# Patient Record
Sex: Female | Born: 1965 | Race: White | Hispanic: No | Marital: Married | Smoking: Never smoker
Health system: Southern US, Community
[De-identification: ages and names within clinical notes are randomized; demographics above are authoritative.]

## PROBLEM LIST (undated history)

## (undated) DIAGNOSIS — Z8669 Personal history of other diseases of the nervous system and sense organs: Secondary | ICD-10-CM

## (undated) DIAGNOSIS — D721 Eosinophilia, unspecified: Secondary | ICD-10-CM

## (undated) DIAGNOSIS — T148XXA Other injury of unspecified body region, initial encounter: Secondary | ICD-10-CM

## (undated) DIAGNOSIS — E119 Type 2 diabetes mellitus without complications: Secondary | ICD-10-CM

## (undated) DIAGNOSIS — I82409 Acute embolism and thrombosis of unspecified deep veins of unspecified lower extremity: Secondary | ICD-10-CM

## (undated) HISTORY — DX: Personal history of other diseases of the nervous system and sense organs: Z86.69

## (undated) HISTORY — DX: Eosinophilia, unspecified: D72.10

## (undated) HISTORY — DX: Type 2 diabetes mellitus without complications: E11.9

## (undated) HISTORY — DX: Other injury of unspecified body region, initial encounter: T14.8XXA

## (undated) HISTORY — PX: WISDOM TOOTH EXTRACTION: SHX21

---

## 1998-10-29 ENCOUNTER — Other Ambulatory Visit: Admission: RE | Admit: 1998-10-29 | Discharge: 1998-10-29 | Payer: Self-pay | Admitting: Obstetrics and Gynecology

## 1999-12-22 ENCOUNTER — Other Ambulatory Visit: Admission: RE | Admit: 1999-12-22 | Discharge: 1999-12-22 | Payer: Self-pay | Admitting: Obstetrics and Gynecology

## 2000-09-29 ENCOUNTER — Emergency Department (HOSPITAL_COMMUNITY): Admission: EM | Admit: 2000-09-29 | Discharge: 2000-09-29 | Payer: Self-pay | Admitting: Internal Medicine

## 2000-12-18 HISTORY — PX: CHOLECYSTECTOMY: SHX55

## 2001-01-22 ENCOUNTER — Other Ambulatory Visit: Admission: RE | Admit: 2001-01-22 | Discharge: 2001-01-22 | Payer: Self-pay | Admitting: Obstetrics and Gynecology

## 2001-02-19 ENCOUNTER — Encounter: Admission: RE | Admit: 2001-02-19 | Discharge: 2001-05-20 | Payer: Self-pay | Admitting: Family Medicine

## 2001-08-28 ENCOUNTER — Encounter: Payer: Self-pay | Admitting: Family Medicine

## 2001-08-28 ENCOUNTER — Encounter: Admission: RE | Admit: 2001-08-28 | Discharge: 2001-08-28 | Payer: Self-pay | Admitting: Family Medicine

## 2001-09-11 ENCOUNTER — Encounter (INDEPENDENT_AMBULATORY_CARE_PROVIDER_SITE_OTHER): Payer: Self-pay | Admitting: Specialist

## 2001-09-11 ENCOUNTER — Observation Stay (HOSPITAL_COMMUNITY): Admission: RE | Admit: 2001-09-11 | Discharge: 2001-09-12 | Payer: Self-pay | Admitting: General Surgery

## 2001-10-07 ENCOUNTER — Encounter: Admission: RE | Admit: 2001-10-07 | Discharge: 2002-01-05 | Payer: Self-pay | Admitting: Family Medicine

## 2002-02-03 ENCOUNTER — Other Ambulatory Visit: Admission: RE | Admit: 2002-02-03 | Discharge: 2002-02-03 | Payer: Self-pay | Admitting: Obstetrics and Gynecology

## 2002-02-25 ENCOUNTER — Encounter: Admission: RE | Admit: 2002-02-25 | Discharge: 2002-05-26 | Payer: Self-pay | Admitting: Family Medicine

## 2002-08-19 ENCOUNTER — Encounter: Payer: Self-pay | Admitting: Obstetrics and Gynecology

## 2002-08-19 ENCOUNTER — Ambulatory Visit (HOSPITAL_COMMUNITY): Admission: RE | Admit: 2002-08-19 | Discharge: 2002-08-19 | Payer: Self-pay | Admitting: Obstetrics and Gynecology

## 2002-11-04 ENCOUNTER — Encounter: Admission: RE | Admit: 2002-11-04 | Discharge: 2002-11-19 | Payer: Self-pay | Admitting: Family Medicine

## 2002-12-08 ENCOUNTER — Ambulatory Visit (HOSPITAL_COMMUNITY): Admission: RE | Admit: 2002-12-08 | Discharge: 2002-12-08 | Payer: Self-pay | Admitting: Obstetrics and Gynecology

## 2002-12-08 ENCOUNTER — Encounter: Payer: Self-pay | Admitting: Obstetrics and Gynecology

## 2002-12-10 ENCOUNTER — Encounter (HOSPITAL_COMMUNITY): Admission: AD | Admit: 2002-12-10 | Discharge: 2002-12-26 | Payer: Self-pay | Admitting: Obstetrics and Gynecology

## 2002-12-19 ENCOUNTER — Encounter: Payer: Self-pay | Admitting: Obstetrics and Gynecology

## 2002-12-26 ENCOUNTER — Encounter: Payer: Self-pay | Admitting: Obstetrics and Gynecology

## 2003-01-07 ENCOUNTER — Encounter (INDEPENDENT_AMBULATORY_CARE_PROVIDER_SITE_OTHER): Payer: Self-pay | Admitting: Specialist

## 2003-01-07 ENCOUNTER — Inpatient Hospital Stay (HOSPITAL_COMMUNITY): Admission: AD | Admit: 2003-01-07 | Discharge: 2003-01-10 | Payer: Self-pay | Admitting: Obstetrics and Gynecology

## 2003-02-20 ENCOUNTER — Other Ambulatory Visit: Admission: RE | Admit: 2003-02-20 | Discharge: 2003-02-20 | Payer: Self-pay | Admitting: Obstetrics and Gynecology

## 2003-07-16 ENCOUNTER — Encounter: Payer: Self-pay | Admitting: Family Medicine

## 2003-07-16 ENCOUNTER — Encounter: Admission: RE | Admit: 2003-07-16 | Discharge: 2003-07-16 | Payer: Self-pay | Admitting: Family Medicine

## 2004-05-04 ENCOUNTER — Other Ambulatory Visit: Admission: RE | Admit: 2004-05-04 | Discharge: 2004-05-04 | Payer: Self-pay | Admitting: Obstetrics and Gynecology

## 2004-12-20 ENCOUNTER — Encounter: Admission: RE | Admit: 2004-12-20 | Discharge: 2005-03-20 | Payer: Self-pay | Admitting: Family Medicine

## 2005-05-10 ENCOUNTER — Other Ambulatory Visit: Admission: RE | Admit: 2005-05-10 | Discharge: 2005-05-10 | Payer: Self-pay | Admitting: Obstetrics and Gynecology

## 2005-06-01 ENCOUNTER — Encounter: Admission: RE | Admit: 2005-06-01 | Discharge: 2005-08-30 | Payer: Self-pay | Admitting: Family Medicine

## 2005-08-10 ENCOUNTER — Ambulatory Visit: Payer: Self-pay | Admitting: Oncology

## 2005-10-18 ENCOUNTER — Ambulatory Visit: Payer: Self-pay | Admitting: Oncology

## 2006-02-15 ENCOUNTER — Ambulatory Visit: Payer: Self-pay | Admitting: Oncology

## 2006-05-17 ENCOUNTER — Other Ambulatory Visit: Admission: RE | Admit: 2006-05-17 | Discharge: 2006-05-17 | Payer: Self-pay | Admitting: Obstetrics and Gynecology

## 2006-07-18 ENCOUNTER — Ambulatory Visit: Payer: Self-pay | Admitting: Oncology

## 2006-07-19 LAB — CBC WITH DIFFERENTIAL/PLATELET
BASO%: 0.7 % (ref 0.0–2.0)
EOS%: 5.1 % (ref 0.0–7.0)
HCT: 38.2 % (ref 34.8–46.6)
MCH: 27.4 pg (ref 26.0–34.0)
MCHC: 34.2 g/dL (ref 32.0–36.0)
NEUT%: 60.5 % (ref 39.6–76.8)
lymph#: 2.2 10*3/uL (ref 0.9–3.3)

## 2006-07-24 LAB — HYPERCOAGULABLE PANEL, COMPREHENSIVE
Anticardiolipin IgM: 11 [MPL'U] (ref <10)
Beta-2 Glyco I IgG: 4 U/mL (ref <20)
Beta-2-Glycoprotein I IgM: 5 U/mL (ref <10)
Protein C, Total: 52 % — ABNORMAL LOW (ref 63–153)
Protein S Activity: 90 % (ref 81–180)
Protein S Ag, Total: 110 % (ref 63–126)

## 2007-11-18 ENCOUNTER — Encounter: Admission: RE | Admit: 2007-11-18 | Discharge: 2007-11-18 | Payer: Self-pay | Admitting: Physician Assistant

## 2008-12-25 ENCOUNTER — Inpatient Hospital Stay (HOSPITAL_COMMUNITY): Admission: EM | Admit: 2008-12-25 | Discharge: 2009-01-02 | Payer: Self-pay | Admitting: *Deleted

## 2009-01-07 ENCOUNTER — Inpatient Hospital Stay (HOSPITAL_COMMUNITY): Admission: EM | Admit: 2009-01-07 | Discharge: 2009-01-18 | Payer: Self-pay | Admitting: Emergency Medicine

## 2009-01-08 ENCOUNTER — Encounter (INDEPENDENT_AMBULATORY_CARE_PROVIDER_SITE_OTHER): Payer: Self-pay | Admitting: Internal Medicine

## 2009-01-08 ENCOUNTER — Ambulatory Visit: Payer: Self-pay | Admitting: Infectious Diseases

## 2009-01-11 ENCOUNTER — Ambulatory Visit: Payer: Self-pay | Admitting: Hematology & Oncology

## 2009-01-14 ENCOUNTER — Encounter (INDEPENDENT_AMBULATORY_CARE_PROVIDER_SITE_OTHER): Payer: Self-pay | Admitting: Interventional Radiology

## 2009-01-21 ENCOUNTER — Ambulatory Visit: Payer: Self-pay | Admitting: Infectious Diseases

## 2009-01-21 DIAGNOSIS — R509 Fever, unspecified: Secondary | ICD-10-CM

## 2009-01-21 DIAGNOSIS — D721 Eosinophilia: Secondary | ICD-10-CM

## 2009-01-21 DIAGNOSIS — D649 Anemia, unspecified: Secondary | ICD-10-CM

## 2009-01-21 LAB — CONVERTED CEMR LAB
Basophils Absolute: 0.1 10*3/uL (ref 0.0–0.1)
Basophils Relative: 1 % (ref 0–1)
Eosinophils Absolute: 4.9 10*3/uL — ABNORMAL HIGH (ref 0.0–0.7)
Eosinophils Relative: 40 % — ABNORMAL HIGH (ref 0–5)
HCT: 33 % — ABNORMAL LOW (ref 36.0–46.0)
Hemoglobin: 10.8 g/dL — ABNORMAL LOW (ref 12.0–15.0)
MCHC: 32.7 g/dL (ref 30.0–36.0)
MCV: 97.6 fL (ref 78.0–100.0)
Monocytes Absolute: 0.6 10*3/uL (ref 0.1–1.0)
RDW: 24.3 % — ABNORMAL HIGH (ref 11.5–15.5)

## 2009-01-25 ENCOUNTER — Ambulatory Visit: Payer: Self-pay | Admitting: Hematology & Oncology

## 2009-02-03 ENCOUNTER — Emergency Department (HOSPITAL_COMMUNITY): Admission: EM | Admit: 2009-02-03 | Discharge: 2009-02-04 | Payer: Self-pay | Admitting: Emergency Medicine

## 2009-02-15 LAB — CBC WITH DIFFERENTIAL (CANCER CENTER ONLY)
BASO#: 0.1 10*3/uL (ref 0.0–0.2)
Eosinophils Absolute: 1.4 10*3/uL — ABNORMAL HIGH (ref 0.0–0.5)
HCT: 40 % (ref 34.8–46.6)
HGB: 12.9 g/dL (ref 11.6–15.9)
LYMPH#: 1.5 10*3/uL (ref 0.9–3.3)
MONO#: 0.4 10*3/uL (ref 0.1–0.9)
NEUT#: 9.6 10*3/uL — ABNORMAL HIGH (ref 1.5–6.5)
NEUT%: 73.9 % (ref 39.6–80.0)
RBC: 4.51 10*6/uL (ref 3.70–5.32)
WBC: 13 10*3/uL — ABNORMAL HIGH (ref 3.9–10.0)

## 2009-02-15 LAB — RETICULOCYTES (CHCC)
ABS Retic: 44.2 10*3/uL (ref 19.0–186.0)
RBC.: 4.42 MIL/uL (ref 3.87–5.11)
Retic Ct Pct: 1 % (ref 0.4–3.1)

## 2009-02-15 LAB — FERRITIN: Ferritin: 181 ng/mL (ref 10–291)

## 2009-02-23 ENCOUNTER — Ambulatory Visit: Payer: Self-pay | Admitting: Infectious Diseases

## 2009-02-27 ENCOUNTER — Observation Stay (HOSPITAL_COMMUNITY): Admission: EM | Admit: 2009-02-27 | Discharge: 2009-02-27 | Payer: Self-pay | Admitting: Emergency Medicine

## 2009-02-27 ENCOUNTER — Encounter (INDEPENDENT_AMBULATORY_CARE_PROVIDER_SITE_OTHER): Payer: Self-pay | Admitting: Emergency Medicine

## 2009-02-27 ENCOUNTER — Ambulatory Visit: Payer: Self-pay | Admitting: Surgery

## 2009-03-09 LAB — CBC WITH DIFFERENTIAL (CANCER CENTER ONLY)
BASO#: 0.1 10*3/uL (ref 0.0–0.2)
EOS%: 28.9 % — ABNORMAL HIGH (ref 0.0–7.0)
Eosinophils Absolute: 2.2 10*3/uL — ABNORMAL HIGH (ref 0.0–0.5)
HCT: 39.5 % (ref 34.8–46.6)
HGB: 12.9 g/dL (ref 11.6–15.9)
MCH: 29.2 pg (ref 26.0–34.0)
MCHC: 32.7 g/dL (ref 32.0–36.0)
MONO%: 5.4 % (ref 0.0–13.0)
NEUT#: 3.6 10*3/uL (ref 1.5–6.5)
NEUT%: 47.4 % (ref 39.6–80.0)
RBC: 4.42 10*6/uL (ref 3.70–5.32)

## 2009-03-09 LAB — PROTIME-INR (CHCC SATELLITE)

## 2009-03-09 LAB — CHCC SATELLITE - SMEAR

## 2009-03-15 ENCOUNTER — Ambulatory Visit: Payer: Self-pay | Admitting: Hematology & Oncology

## 2009-03-16 LAB — CBC WITH DIFFERENTIAL (CANCER CENTER ONLY)
BASO#: 0.1 10*3/uL (ref 0.0–0.2)
EOS%: 30.6 % — ABNORMAL HIGH (ref 0.0–7.0)
Eosinophils Absolute: 2.9 10*3/uL — ABNORMAL HIGH (ref 0.0–0.5)
HGB: 11.9 g/dL (ref 11.6–15.9)
LYMPH%: 21.2 % (ref 14.0–48.0)
MCH: 29 pg (ref 26.0–34.0)
MCHC: 32.6 g/dL (ref 32.0–36.0)
MCV: 89 fL (ref 81–101)
MONO%: 4.4 % (ref 0.0–13.0)
NEUT#: 4.1 10*3/uL (ref 1.5–6.5)
Platelets: 238 10*3/uL (ref 145–400)
RBC: 4.1 10*6/uL (ref 3.70–5.32)

## 2009-03-16 LAB — TECHNOLOGIST REVIEW CHCC SATELLITE

## 2009-03-16 LAB — PROTIME-INR (CHCC SATELLITE)
INR: 2.1 (ref 2.0–3.5)
Protime: 25.2 Seconds — ABNORMAL HIGH (ref 10.6–13.4)

## 2009-03-23 LAB — CBC WITH DIFFERENTIAL (CANCER CENTER ONLY)
Eosinophils Absolute: 2.7 10*3/uL — ABNORMAL HIGH (ref 0.0–0.5)
HCT: 39.3 % (ref 34.8–46.6)
HGB: 12.9 g/dL (ref 11.6–15.9)
LYMPH#: 1.9 10*3/uL (ref 0.9–3.3)
MCH: 28.7 pg (ref 26.0–34.0)
MCHC: 32.8 g/dL (ref 32.0–36.0)
MCV: 88 fL (ref 81–101)
MONO#: 0.5 10*3/uL (ref 0.1–0.9)
MONO%: 4.9 % (ref 0.0–13.0)
NEUT#: 4.1 10*3/uL (ref 1.5–6.5)
RDW: 13.3 % (ref 10.5–14.6)

## 2009-03-23 LAB — PROTIME-INR (CHCC SATELLITE)

## 2009-03-26 ENCOUNTER — Ambulatory Visit: Payer: Self-pay | Admitting: Internal Medicine

## 2009-03-26 ENCOUNTER — Inpatient Hospital Stay (HOSPITAL_COMMUNITY): Admission: EM | Admit: 2009-03-26 | Discharge: 2009-03-29 | Payer: Self-pay | Admitting: Emergency Medicine

## 2009-03-27 ENCOUNTER — Ambulatory Visit: Payer: Self-pay | Admitting: *Deleted

## 2009-03-27 ENCOUNTER — Encounter (INDEPENDENT_AMBULATORY_CARE_PROVIDER_SITE_OTHER): Payer: Self-pay | Admitting: Diagnostic Radiology

## 2009-03-31 LAB — CBC WITH DIFFERENTIAL (CANCER CENTER ONLY)
BASO%: 0.8 % (ref 0.0–2.0)
EOS%: 37.8 % — ABNORMAL HIGH (ref 0.0–7.0)
HCT: 37 % (ref 34.8–46.6)
LYMPH%: 17.2 % (ref 14.0–48.0)
MCH: 28.3 pg (ref 26.0–34.0)
MCHC: 33.1 g/dL (ref 32.0–36.0)
MCV: 86 fL (ref 81–101)
NEUT%: 40.2 % (ref 39.6–80.0)
RDW: 13.3 % (ref 10.5–14.6)

## 2009-03-31 LAB — PROTIME-INR (CHCC SATELLITE): Protime: 37.2 Seconds — ABNORMAL HIGH (ref 10.6–13.4)

## 2009-04-06 LAB — CBC WITH DIFFERENTIAL (CANCER CENTER ONLY)
Eosinophils Absolute: 1.2 10*3/uL — ABNORMAL HIGH (ref 0.0–0.5)
HCT: 38.1 % (ref 34.8–46.6)
LYMPH%: 31.6 % (ref 14.0–48.0)
MCV: 85 fL (ref 81–101)
MONO#: 0.7 10*3/uL (ref 0.1–0.9)
NEUT%: 51.6 % (ref 39.6–80.0)
RBC: 4.47 10*6/uL (ref 3.70–5.32)
RDW: 12.5 % (ref 10.5–14.6)
WBC: 11.7 10*3/uL — ABNORMAL HIGH (ref 3.9–10.0)

## 2009-04-06 LAB — PROTIME-INR (CHCC SATELLITE): Protime: 28.8 Seconds — ABNORMAL HIGH (ref 10.6–13.4)

## 2009-04-13 LAB — CBC WITH DIFFERENTIAL (CANCER CENTER ONLY)
Eosinophils Absolute: 1.2 10*3/uL — ABNORMAL HIGH (ref 0.0–0.5)
HCT: 39.7 % (ref 34.8–46.6)
HGB: 13.3 g/dL (ref 11.6–15.9)
LYMPH%: 24.3 % (ref 14.0–48.0)
MCV: 85 fL (ref 81–101)
MONO#: 0.7 10*3/uL (ref 0.1–0.9)
Platelets: 230 10*3/uL (ref 145–400)
RBC: 4.65 10*6/uL (ref 3.70–5.32)
WBC: 10.6 10*3/uL — ABNORMAL HIGH (ref 3.9–10.0)

## 2009-05-03 ENCOUNTER — Ambulatory Visit: Payer: Self-pay | Admitting: Hematology & Oncology

## 2009-05-04 LAB — CBC WITH DIFFERENTIAL (CANCER CENTER ONLY)
BASO#: 0.1 10*3/uL (ref 0.0–0.2)
Eosinophils Absolute: 0.8 10*3/uL — ABNORMAL HIGH (ref 0.0–0.5)
HCT: 37.8 % (ref 34.8–46.6)
HGB: 13.1 g/dL (ref 11.6–15.9)
LYMPH%: 28.5 % (ref 14.0–48.0)
MCV: 82 fL (ref 81–101)
MONO#: 0.5 10*3/uL (ref 0.1–0.9)
NEUT%: 56.9 % (ref 39.6–80.0)
RBC: 4.61 10*6/uL (ref 3.70–5.32)
RDW: 12.3 % (ref 10.5–14.6)
WBC: 9.4 10*3/uL (ref 3.9–10.0)

## 2009-05-04 LAB — PROTIME-INR (CHCC SATELLITE): INR: 1.5 — ABNORMAL LOW (ref 2.0–3.5)

## 2009-05-04 LAB — MORPHOLOGY - CHCC SATELLITE
PLT EST ~~LOC~~: ADEQUATE
Platelet Morphology: NORMAL

## 2009-05-18 LAB — CBC WITH DIFFERENTIAL (CANCER CENTER ONLY)
BASO%: 0.7 % (ref 0.0–2.0)
Eosinophils Absolute: 1 10*3/uL — ABNORMAL HIGH (ref 0.0–0.5)
MCH: 27.2 pg (ref 26.0–34.0)
MONO#: 0.6 10*3/uL (ref 0.1–0.9)
MONO%: 5.5 % (ref 0.0–13.0)
NEUT#: 5.8 10*3/uL (ref 1.5–6.5)
Platelets: 227 10*3/uL (ref 145–400)
RBC: 4.84 10*6/uL (ref 3.70–5.32)
RDW: 12.8 % (ref 10.5–14.6)
WBC: 10.4 10*3/uL — ABNORMAL HIGH (ref 3.9–10.0)

## 2009-05-18 LAB — MORPHOLOGY - CHCC SATELLITE

## 2009-05-18 LAB — PROTIME-INR (CHCC SATELLITE)
INR: 3.3 (ref 2.0–3.5)
Protime: 39.6 Seconds — ABNORMAL HIGH (ref 10.6–13.4)

## 2009-05-26 ENCOUNTER — Ambulatory Visit (HOSPITAL_COMMUNITY): Admission: RE | Admit: 2009-05-26 | Discharge: 2009-05-26 | Payer: Self-pay | Admitting: Hematology & Oncology

## 2009-05-26 ENCOUNTER — Encounter: Payer: Self-pay | Admitting: Hematology & Oncology

## 2009-05-26 ENCOUNTER — Ambulatory Visit: Payer: Self-pay | Admitting: Hematology & Oncology

## 2009-06-01 LAB — CBC WITH DIFFERENTIAL (CANCER CENTER ONLY)
Eosinophils Absolute: 0.8 10*3/uL — ABNORMAL HIGH (ref 0.0–0.5)
LYMPH#: 2.2 10*3/uL (ref 0.9–3.3)
LYMPH%: 24.9 % (ref 14.0–48.0)
MCV: 82 fL (ref 81–101)
MONO#: 0.5 10*3/uL (ref 0.1–0.9)
Platelets: 230 10*3/uL (ref 145–400)
RBC: 4.66 10*6/uL (ref 3.70–5.32)
WBC: 8.9 10*3/uL (ref 3.9–10.0)

## 2009-06-01 LAB — PROTIME-INR (CHCC SATELLITE): INR: 2.2 (ref 2.0–3.5)

## 2009-06-01 LAB — COMPREHENSIVE METABOLIC PANEL
ALT: 17 U/L (ref 0–35)
AST: 13 U/L (ref 0–37)
Alkaline Phosphatase: 61 U/L (ref 39–117)
BUN: 14 mg/dL (ref 6–23)
Calcium: 8.7 mg/dL (ref 8.4–10.5)
Creatinine, Ser: 0.73 mg/dL (ref 0.40–1.20)
Total Bilirubin: 0.6 mg/dL (ref 0.3–1.2)

## 2009-06-01 LAB — MORPHOLOGY - CHCC SATELLITE
PLT EST ~~LOC~~: ADEQUATE
Platelet Morphology: NORMAL

## 2009-06-22 ENCOUNTER — Ambulatory Visit: Payer: Self-pay | Admitting: Hematology & Oncology

## 2009-06-22 LAB — CBC WITH DIFFERENTIAL (CANCER CENTER ONLY)
BASO#: 0.1 10*3/uL (ref 0.0–0.2)
EOS%: 8.5 % — ABNORMAL HIGH (ref 0.0–7.0)
HCT: 37.8 % (ref 34.8–46.6)
HGB: 12.1 g/dL (ref 11.6–15.9)
LYMPH%: 23.6 % (ref 14.0–48.0)
MCH: 26.1 pg (ref 26.0–34.0)
MCHC: 32.1 g/dL (ref 32.0–36.0)
MONO%: 5.9 % (ref 0.0–13.0)
NEUT%: 60.9 % (ref 39.6–80.0)

## 2009-06-22 LAB — PROTIME-INR (CHCC SATELLITE): INR: 2.9 (ref 2.0–3.5)

## 2009-07-13 LAB — CBC WITH DIFFERENTIAL (CANCER CENTER ONLY)
EOS%: 9.8 % — ABNORMAL HIGH (ref 0.0–7.0)
MCH: 26.1 pg (ref 26.0–34.0)
MCHC: 33 g/dL (ref 32.0–36.0)
MONO%: 5.4 % (ref 0.0–13.0)
NEUT#: 4.1 10*3/uL (ref 1.5–6.5)
Platelets: 248 10*3/uL (ref 145–400)

## 2009-07-13 LAB — CHCC SATELLITE - SMEAR

## 2009-07-13 LAB — PROTIME-INR (CHCC SATELLITE)
INR: 2.7 (ref 2.0–3.5)
Protime: 32.4 Seconds — ABNORMAL HIGH (ref 10.6–13.4)

## 2009-08-02 ENCOUNTER — Ambulatory Visit: Payer: Self-pay | Admitting: Hematology & Oncology

## 2009-08-03 LAB — CBC WITH DIFFERENTIAL (CANCER CENTER ONLY)
BASO#: 0.1 10*3/uL (ref 0.0–0.2)
EOS%: 7.2 % — ABNORMAL HIGH (ref 0.0–7.0)
HCT: 35.6 % (ref 34.8–46.6)
HGB: 11.8 g/dL (ref 11.6–15.9)
LYMPH#: 2.7 10*3/uL (ref 0.9–3.3)
MCHC: 33.2 g/dL (ref 32.0–36.0)
MONO#: 0.6 10*3/uL (ref 0.1–0.9)
NEUT%: 58.7 % (ref 39.6–80.0)

## 2009-08-03 LAB — PROTIME-INR (CHCC SATELLITE)
INR: 3.1 (ref 2.0–3.5)
Protime: 37.2 Seconds — ABNORMAL HIGH (ref 10.6–13.4)

## 2009-08-31 LAB — CBC WITH DIFFERENTIAL (CANCER CENTER ONLY)
BASO#: 0.1 10*3/uL (ref 0.0–0.2)
BASO%: 0.9 % (ref 0.0–2.0)
Eosinophils Absolute: 0.8 10*3/uL — ABNORMAL HIGH (ref 0.0–0.5)
HCT: 37.9 % (ref 34.8–46.6)
HGB: 12.5 g/dL (ref 11.6–15.9)
LYMPH#: 2.6 10*3/uL (ref 0.9–3.3)
MONO#: 0.5 10*3/uL (ref 0.1–0.9)
NEUT%: 56.2 % (ref 39.6–80.0)
RBC: 4.88 10*6/uL (ref 3.70–5.32)
RDW: 13.3 % (ref 10.5–14.6)
WBC: 8.9 10*3/uL (ref 3.9–10.0)

## 2009-08-31 LAB — PROTIME-INR (CHCC SATELLITE): INR: 2.7 (ref 2.0–3.5)

## 2009-09-27 ENCOUNTER — Ambulatory Visit: Payer: Self-pay | Admitting: Hematology & Oncology

## 2009-09-28 LAB — CBC WITH DIFFERENTIAL (CANCER CENTER ONLY)
BASO#: 0.1 10*3/uL (ref 0.0–0.2)
EOS%: 7.3 % — ABNORMAL HIGH (ref 0.0–7.0)
Eosinophils Absolute: 0.7 10*3/uL — ABNORMAL HIGH (ref 0.0–0.5)
HCT: 35.6 % (ref 34.8–46.6)
HGB: 11.9 g/dL (ref 11.6–15.9)
LYMPH#: 2.2 10*3/uL (ref 0.9–3.3)
MCH: 25.4 pg — ABNORMAL LOW (ref 26.0–34.0)
MCHC: 33.5 g/dL (ref 32.0–36.0)
NEUT%: 62.9 % (ref 39.6–80.0)
RBC: 4.69 10*6/uL (ref 3.70–5.32)

## 2009-09-28 LAB — COMPREHENSIVE METABOLIC PANEL
Alkaline Phosphatase: 77 U/L (ref 39–117)
Creatinine, Ser: 0.68 mg/dL (ref 0.40–1.20)
Glucose, Bld: 129 mg/dL — ABNORMAL HIGH (ref 70–99)
Sodium: 139 mEq/L (ref 135–145)
Total Bilirubin: 0.5 mg/dL (ref 0.3–1.2)
Total Protein: 6.8 g/dL (ref 6.0–8.3)

## 2009-09-28 LAB — PROTIME-INR (CHCC SATELLITE)

## 2009-10-21 ENCOUNTER — Encounter: Admission: RE | Admit: 2009-10-21 | Discharge: 2009-10-21 | Payer: Self-pay | Admitting: Obstetrics and Gynecology

## 2009-11-01 ENCOUNTER — Ambulatory Visit: Payer: Self-pay | Admitting: Hematology & Oncology

## 2009-11-02 LAB — CBC WITH DIFFERENTIAL (CANCER CENTER ONLY)
BASO#: 0.1 10*3/uL (ref 0.0–0.2)
BASO%: 0.6 % (ref 0.0–2.0)
HCT: 34.2 % — ABNORMAL LOW (ref 34.8–46.6)
HGB: 11.7 g/dL (ref 11.6–15.9)
LYMPH#: 2.1 10*3/uL (ref 0.9–3.3)
MONO#: 0.5 10*3/uL (ref 0.1–0.9)
NEUT#: 7 10*3/uL — ABNORMAL HIGH (ref 1.5–6.5)
NEUT%: 67.9 % (ref 39.6–80.0)
WBC: 10.4 10*3/uL — ABNORMAL HIGH (ref 3.9–10.0)

## 2009-11-02 LAB — PROTIME-INR (CHCC SATELLITE)
INR: 2.8 (ref 2.0–3.5)
Protime: 33.6 Seconds — ABNORMAL HIGH (ref 10.6–13.4)

## 2009-11-02 LAB — MORPHOLOGY - CHCC SATELLITE

## 2009-11-03 LAB — VITAMIN D 25 HYDROXY (VIT D DEFICIENCY, FRACTURES): Vit D, 25-Hydroxy: 30 ng/mL (ref 30–89)

## 2009-11-25 ENCOUNTER — Observation Stay (HOSPITAL_COMMUNITY): Admission: EM | Admit: 2009-11-25 | Discharge: 2009-11-26 | Payer: Self-pay | Admitting: Emergency Medicine

## 2009-11-26 ENCOUNTER — Ambulatory Visit: Payer: Self-pay | Admitting: Vascular Surgery

## 2009-12-02 ENCOUNTER — Ambulatory Visit: Payer: Self-pay | Admitting: Hematology & Oncology

## 2009-12-03 LAB — CBC WITH DIFFERENTIAL (CANCER CENTER ONLY)
BASO%: 0.9 % (ref 0.0–2.0)
LYMPH#: 2.4 10*3/uL (ref 0.9–3.3)
MONO#: 0.4 10*3/uL (ref 0.1–0.9)
NEUT#: 4.3 10*3/uL (ref 1.5–6.5)
Platelets: 239 10*3/uL (ref 145–400)
RDW: 17 % — ABNORMAL HIGH (ref 10.5–14.6)
WBC: 8.4 10*3/uL (ref 3.9–10.0)

## 2009-12-03 LAB — PROTIME-INR (CHCC SATELLITE): Protime: 13.2 Seconds (ref 10.6–13.4)

## 2009-12-21 LAB — CBC WITH DIFFERENTIAL (CANCER CENTER ONLY)
BASO%: 0.5 % (ref 0.0–2.0)
HCT: 41.1 % (ref 34.8–46.6)
LYMPH%: 24.3 % (ref 14.0–48.0)
MCH: 26.5 pg (ref 26.0–34.0)
MCV: 81 fL (ref 81–101)
MONO%: 5.4 % (ref 0.0–13.0)
NEUT%: 66.4 % (ref 39.6–80.0)
Platelets: 258 10*3/uL (ref 145–400)
RDW: 18.4 % — ABNORMAL HIGH (ref 10.5–14.6)

## 2009-12-21 LAB — LACTATE DEHYDROGENASE: LDH: 154 U/L (ref 94–250)

## 2009-12-21 LAB — CHCC SATELLITE - SMEAR

## 2010-01-17 ENCOUNTER — Ambulatory Visit: Payer: Self-pay | Admitting: Hematology & Oncology

## 2010-01-18 LAB — PROTIME-INR (CHCC SATELLITE): Protime: 12 Seconds (ref 10.6–13.4)

## 2010-01-18 LAB — CBC WITH DIFFERENTIAL (CANCER CENTER ONLY)
BASO%: 0.7 % (ref 0.0–2.0)
EOS%: 4.2 % (ref 0.0–7.0)
HCT: 41.3 % (ref 34.8–46.6)
LYMPH%: 25.3 % (ref 14.0–48.0)
MCHC: 33.7 g/dL (ref 32.0–36.0)
MCV: 82 fL (ref 81–101)
MONO%: 5.1 % (ref 0.0–13.0)
NEUT%: 64.7 % (ref 39.6–80.0)
RDW: 15 % — ABNORMAL HIGH (ref 10.5–14.6)

## 2010-02-08 ENCOUNTER — Ambulatory Visit: Payer: Self-pay | Admitting: Diagnostic Radiology

## 2010-02-08 ENCOUNTER — Ambulatory Visit (HOSPITAL_BASED_OUTPATIENT_CLINIC_OR_DEPARTMENT_OTHER): Admission: RE | Admit: 2010-02-08 | Discharge: 2010-02-08 | Payer: Self-pay | Admitting: Hematology & Oncology

## 2010-02-15 LAB — CBC WITH DIFFERENTIAL (CANCER CENTER ONLY)
BASO%: 0.6 % (ref 0.0–2.0)
Eosinophils Absolute: 0.6 10*3/uL — ABNORMAL HIGH (ref 0.0–0.5)
HCT: 40.1 % (ref 34.8–46.6)
LYMPH#: 2.3 10*3/uL (ref 0.9–3.3)
MONO#: 0.5 10*3/uL (ref 0.1–0.9)
NEUT%: 67.1 % (ref 39.6–80.0)
Platelets: 250 10*3/uL (ref 145–400)
RBC: 4.88 10*6/uL (ref 3.70–5.32)
RDW: 12.2 % (ref 10.5–14.6)
WBC: 10.4 10*3/uL — ABNORMAL HIGH (ref 3.9–10.0)

## 2010-02-15 LAB — VITAMIN D 25 HYDROXY (VIT D DEFICIENCY, FRACTURES): Vit D, 25-Hydroxy: 35 ng/mL (ref 30–89)

## 2010-02-15 LAB — CHCC SATELLITE - SMEAR

## 2010-02-15 LAB — D-DIMER, QUANTITATIVE: D-Dimer, Quant: 0.22 ug/mL-FEU (ref 0.00–0.48)

## 2010-03-28 ENCOUNTER — Ambulatory Visit: Payer: Self-pay | Admitting: Hematology & Oncology

## 2010-03-29 LAB — CBC WITH DIFFERENTIAL (CANCER CENTER ONLY)
BASO#: 0.1 10*3/uL (ref 0.0–0.2)
Eosinophils Absolute: 0.7 10*3/uL — ABNORMAL HIGH (ref 0.0–0.5)
HGB: 13.1 g/dL (ref 11.6–15.9)
LYMPH#: 2.4 10*3/uL (ref 0.9–3.3)
MCH: 27.5 pg (ref 26.0–34.0)
MONO%: 4.1 % (ref 0.0–13.0)
NEUT#: 5.2 10*3/uL (ref 1.5–6.5)
Platelets: 257 10*3/uL (ref 145–400)
RBC: 4.77 10*6/uL (ref 3.70–5.32)
WBC: 8.8 10*3/uL (ref 3.9–10.0)

## 2010-03-29 LAB — CHCC SATELLITE - SMEAR

## 2010-05-23 ENCOUNTER — Ambulatory Visit: Payer: Self-pay | Admitting: Hematology & Oncology

## 2010-05-24 LAB — CBC WITH DIFFERENTIAL (CANCER CENTER ONLY)
BASO#: 0.1 10*3/uL (ref 0.0–0.2)
BASO%: 0.6 % (ref 0.0–2.0)
Eosinophils Absolute: 0.7 10*3/uL — ABNORMAL HIGH (ref 0.0–0.5)
HCT: 40.4 % (ref 34.8–46.6)
HGB: 13.5 g/dL (ref 11.6–15.9)
LYMPH#: 2.2 10*3/uL (ref 0.9–3.3)
LYMPH%: 24.8 % (ref 14.0–48.0)
MCV: 81 fL (ref 81–101)
MONO#: 0.5 10*3/uL (ref 0.1–0.9)
NEUT%: 62.2 % (ref 39.6–80.0)
RBC: 4.98 10*6/uL (ref 3.70–5.32)
RDW: 12.8 % (ref 10.5–14.6)
WBC: 9 10*3/uL (ref 3.9–10.0)

## 2010-05-24 LAB — RETICULOCYTES (CHCC)
ABS Retic: 49.9 10*3/uL (ref 19.0–186.0)
RBC.: 4.99 MIL/uL (ref 3.87–5.11)

## 2010-05-24 LAB — FERRITIN: Ferritin: 29 ng/mL (ref 10–291)

## 2010-08-29 ENCOUNTER — Ambulatory Visit: Payer: Self-pay | Admitting: Hematology & Oncology

## 2010-08-30 LAB — CHCC SATELLITE - SMEAR

## 2010-08-30 LAB — CBC WITH DIFFERENTIAL (CANCER CENTER ONLY)
BASO#: 0 10*3/uL (ref 0.0–0.2)
Eosinophils Absolute: 0.6 10*3/uL — ABNORMAL HIGH (ref 0.0–0.5)
HGB: 13.5 g/dL (ref 11.6–15.9)
MCH: 27.6 pg (ref 26.0–34.0)
MCV: 82 fL (ref 81–101)
MONO#: 0.3 10*3/uL (ref 0.1–0.9)
MONO%: 4.6 % (ref 0.0–13.0)
NEUT#: 4.4 10*3/uL (ref 1.5–6.5)
Platelets: 255 10*3/uL (ref 145–400)
RBC: 4.91 10*6/uL (ref 3.70–5.32)
WBC: 7.3 10*3/uL (ref 3.9–10.0)

## 2010-11-16 IMAGING — CT CT ABDOMEN W/ CM
2 of 4 series · 14 of 42 positions shown, 18 images · IV contrast (APPLIED)
Comparison: 01/08/2009.

CT ABDOMEN

CLINICAL DATA: History of pneumonia.  Elevated white blood count.
Splenomegaly.

CT ABDOMEN AND PELVIS WITH CONTRAST
TECHNIQUE: Multidetector CT imaging of the abdomen and pelvis was
performed using the standard protocol following bolus
administration of intravenous contrast.
Contrast: 100 ml [id number]

[Series 2: abd/pelv with 5.0 b31f st · axial · 0.81mm/px · z∈[-466,-80]mm · 11 of 92 slices shown, 15 images]
[im 10/92  soft-tissue]
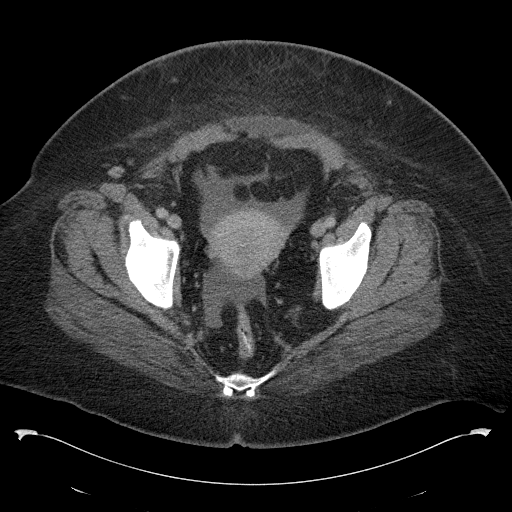
[im 10/92  bone]
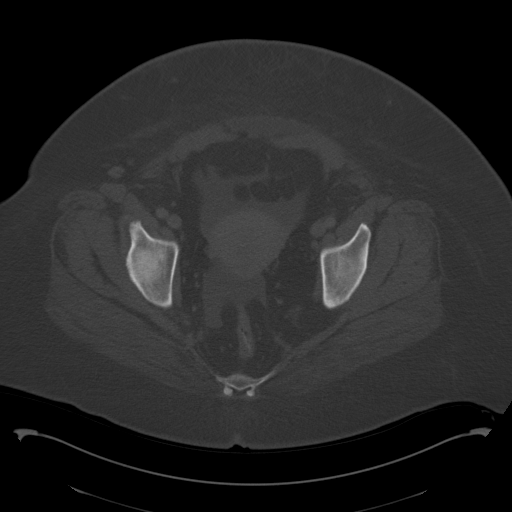
[im 20/92  soft-tissue]
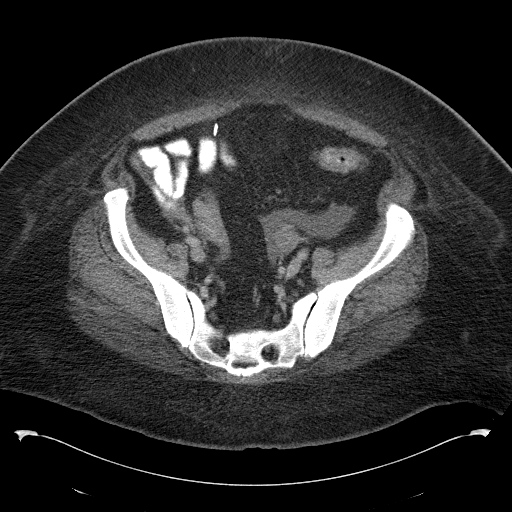
[im 29/92  soft-tissue]
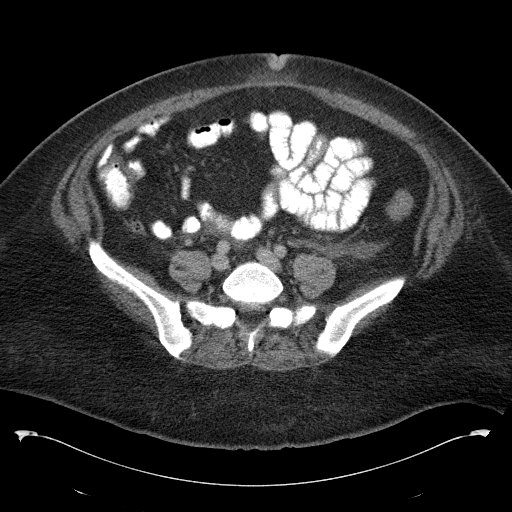
[im 39/92  soft-tissue]
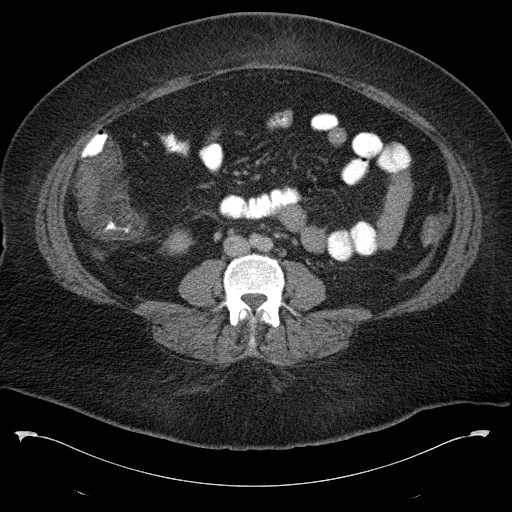
[im 48/92  soft-tissue]
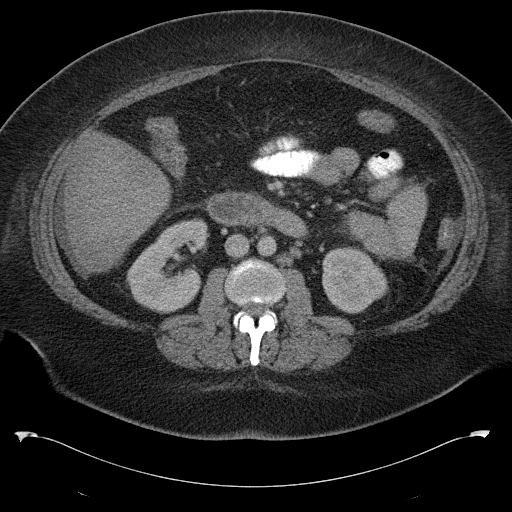
[im 58/92  soft-tissue]
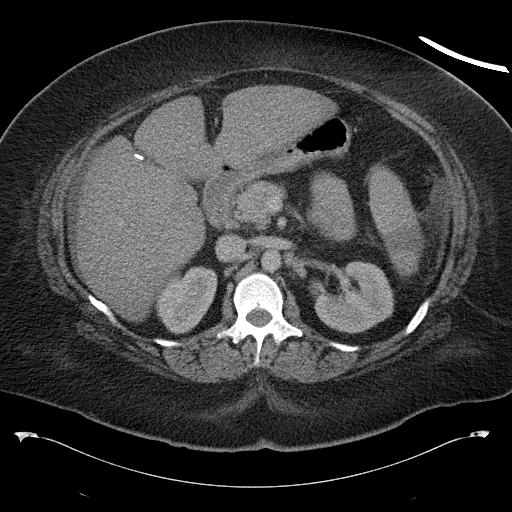
[im 68/92  soft-tissue]
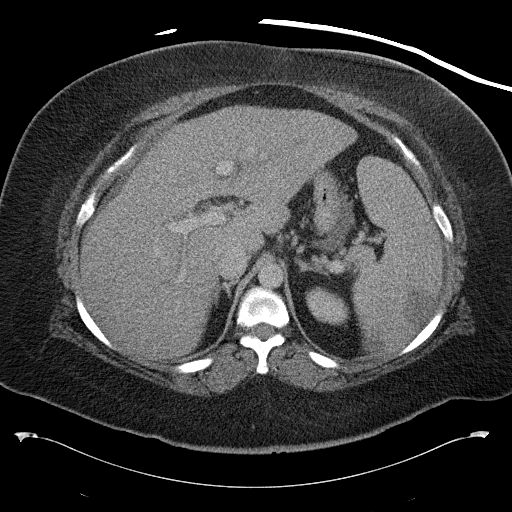
[im 72/92  lung]
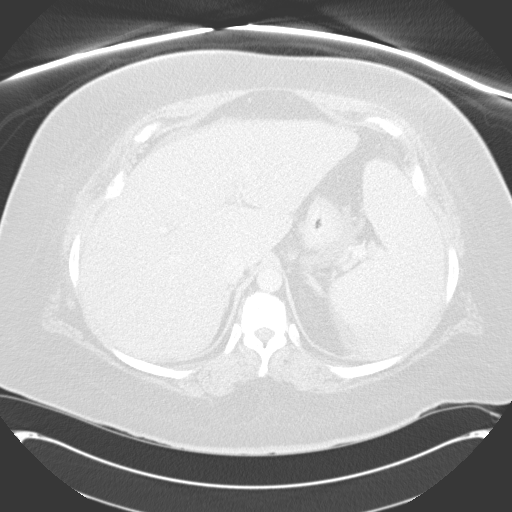
[im 77/92  soft-tissue]
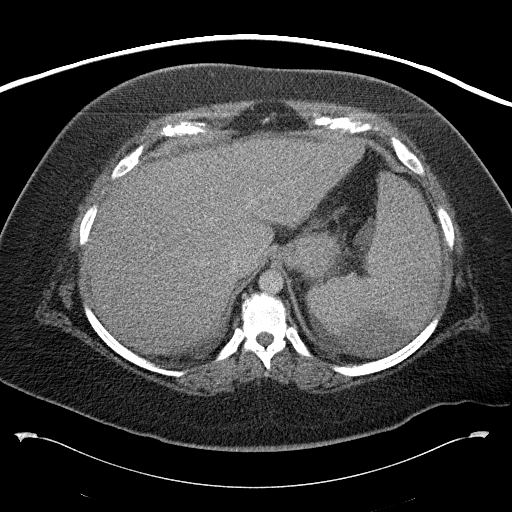
[im 77/92  lung]
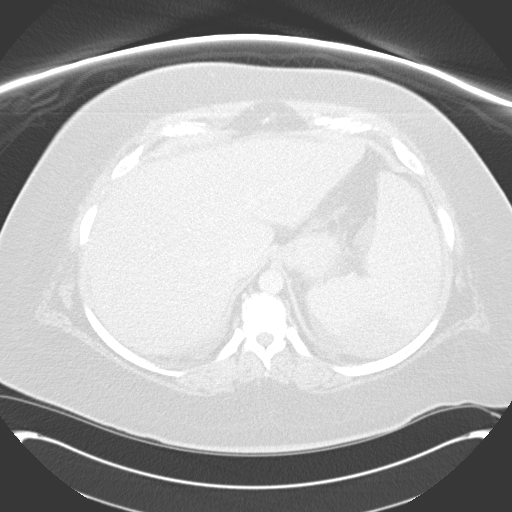
[im 82/92  lung]
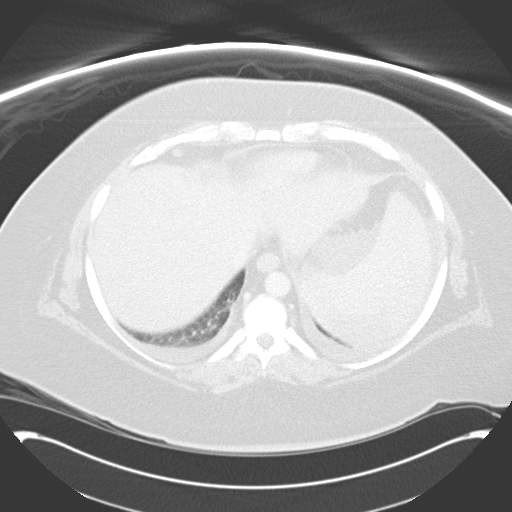
[im 87/92  soft-tissue]
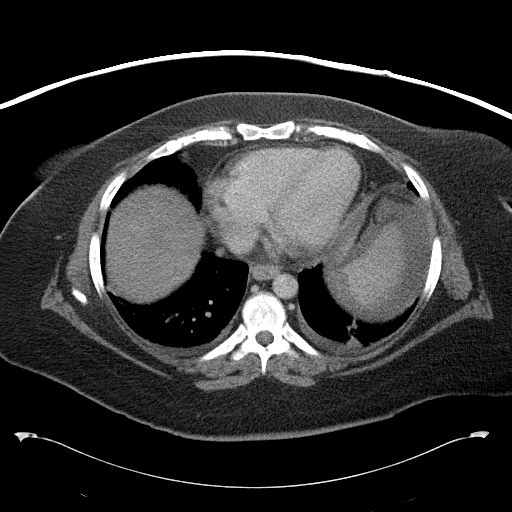
[im 87/92  lung]
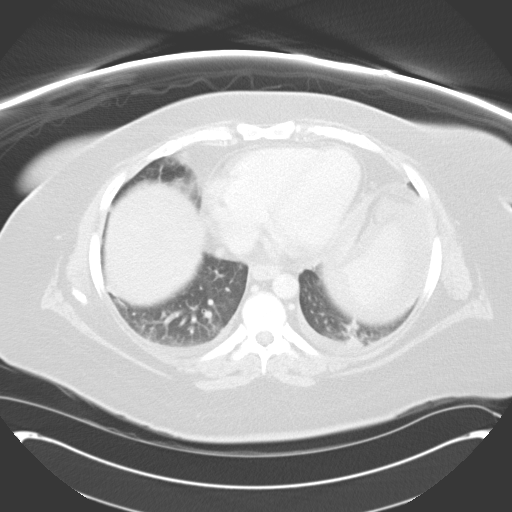
[im 87/92  bone]
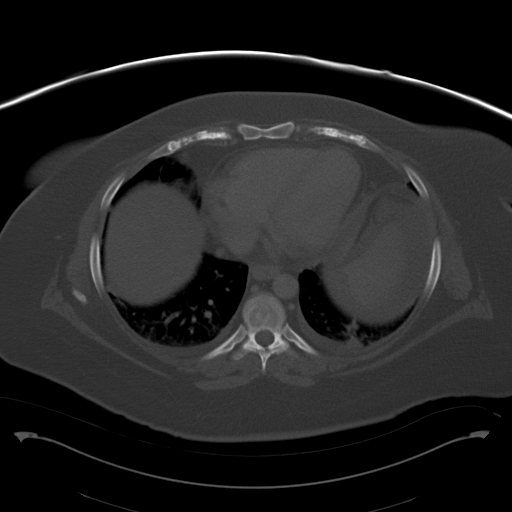

[Series 4: abd/pelv with 2.0 spo st · coronal · 0.89mm/px · 3 of 117 slices shown]
[im 39/117  soft-tissue]
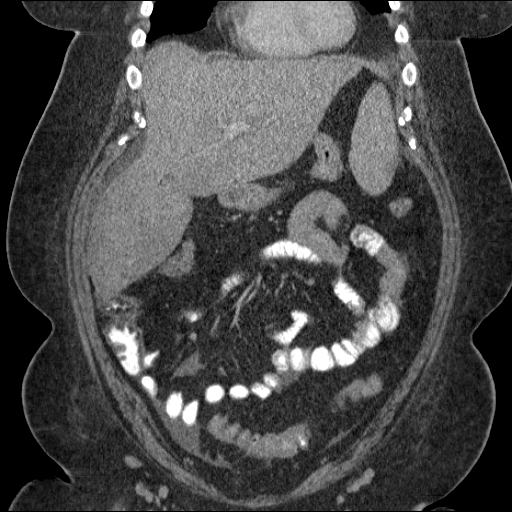
[im 52/117  soft-tissue]
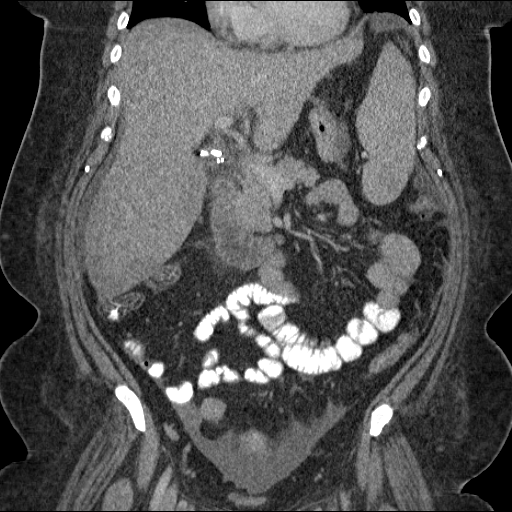
[im 65/117  soft-tissue]
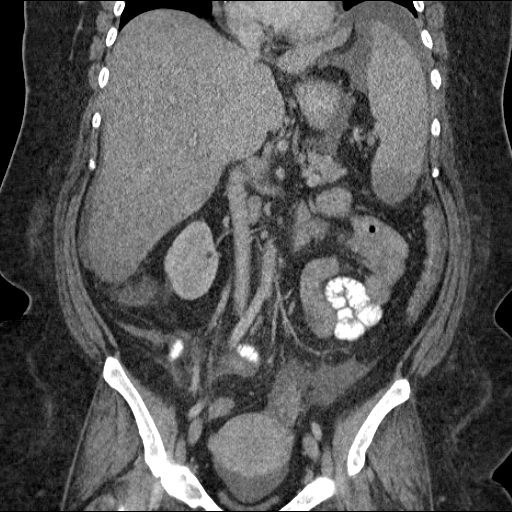

[14 of 42 positions shown; findings below may reference images not displayed]

FINDINGS: The lung bases demonstrates small bilateral pleural
effusions and bibasilar atelectasis.

There is hepatosplenomegaly.  The spleen measures 16.1 x 14.6 x
10.8 cm.  There are multiple splenic infarctions with a small
amount of perisplenic fluid.  This also a small amount of fluid
around the liver and in the lesser sac.  The the patient has had a
cholecystectomy.  No biliary dilatation.  Pancreas, adrenal glands
and kidneys are unremarkable.  There are scattered borderline
gastrohepatic ligament, periportal, celiac axis and retroperitoneal
lymph nodes.

The stomach, duodenum and small bowel are unremarkable.  There is a
mild diffuse inflammatory process involving the colon.

The aorta is normal in caliber.  The major branch vessels are
normal.  The portal and splenic veins are patent.
IMPRESSION: 1.  Hepatosplenomegaly.
2.  Multiple splenic infarctions.
3.  New abdominal fluid collections.
4.  Borderline abdominal adenopathy. Lymphoma would certainly be a
consideration.
5. Diffuse inflammatory process involving the colon.
4.  Small bilateral pleural effusions and bibasilar atelectasis.
Small pulmonary nodules again noted.

CT PELVIS
FINDINGS: Mild diffuse inflammatory process involving the colon.
There is a small to moderate amount of free pelvic fluid.  The
uterus and ovaries are unremarkable.  Borderline enlarged pelvic
lymph nodes and inguinal lymph nodes.  The bony pelvis is
unremarkable.
IMPRESSION: 1.  Moderate free pelvic fluid.
2.  Diffuse inflammatory process involving the colon.
3.  Borderline pelvic and inguinal lymph nodes.

## 2010-12-14 ENCOUNTER — Encounter
Admission: RE | Admit: 2010-12-14 | Discharge: 2010-12-14 | Payer: Self-pay | Source: Home / Self Care | Attending: Obstetrics and Gynecology | Admitting: Obstetrics and Gynecology

## 2010-12-26 ENCOUNTER — Ambulatory Visit: Payer: Self-pay | Admitting: Hematology & Oncology

## 2010-12-27 LAB — CBC WITH DIFFERENTIAL (CANCER CENTER ONLY)
BASO#: 0.1 10*3/uL (ref 0.0–0.2)
BASO%: 0.5 % (ref 0.0–2.0)
EOS%: 7.8 % — ABNORMAL HIGH (ref 0.0–7.0)
Eosinophils Absolute: 0.7 10*3/uL — ABNORMAL HIGH (ref 0.0–0.5)
HCT: 39.4 % (ref 34.8–46.6)
HGB: 13.2 g/dL (ref 11.6–15.9)
LYMPH#: 1.9 10*3/uL (ref 0.9–3.3)
LYMPH%: 21 % (ref 14.0–48.0)
MCH: 27.5 pg (ref 26.0–34.0)
MCHC: 33.5 g/dL (ref 32.0–36.0)
MCV: 82 fL (ref 81–101)
MONO#: 0.4 10*3/uL (ref 0.1–0.9)
MONO%: 4.8 % (ref 0.0–13.0)
NEUT#: 6.1 10*3/uL (ref 1.5–6.5)
NEUT%: 65.9 % (ref 39.6–80.0)
Platelets: 242 10*3/uL (ref 145–400)
RBC: 4.81 10*6/uL (ref 3.70–5.32)
RDW: 12.8 % (ref 10.5–14.6)
WBC: 9.2 10*3/uL (ref 3.9–10.0)

## 2010-12-27 LAB — CHCC SATELLITE - SMEAR

## 2011-02-21 ENCOUNTER — Encounter (HOSPITAL_BASED_OUTPATIENT_CLINIC_OR_DEPARTMENT_OTHER): Payer: BC Managed Care – PPO | Admitting: Hematology & Oncology

## 2011-02-21 ENCOUNTER — Other Ambulatory Visit: Payer: Self-pay | Admitting: Hematology & Oncology

## 2011-02-21 DIAGNOSIS — Z7901 Long term (current) use of anticoagulants: Secondary | ICD-10-CM

## 2011-02-21 DIAGNOSIS — I82409 Acute embolism and thrombosis of unspecified deep veins of unspecified lower extremity: Secondary | ICD-10-CM

## 2011-02-21 LAB — CBC WITH DIFFERENTIAL (CANCER CENTER ONLY)
BASO#: 0 10*3/uL (ref 0.0–0.2)
Eosinophils Absolute: 0.6 10*3/uL — ABNORMAL HIGH (ref 0.0–0.5)
HGB: 13.1 g/dL (ref 11.6–15.9)
LYMPH#: 1.9 10*3/uL (ref 0.9–3.3)
MONO#: 0.5 10*3/uL (ref 0.1–0.9)
NEUT#: 6.4 10*3/uL (ref 1.5–6.5)
Platelets: 254 10*3/uL (ref 145–400)
RBC: 4.87 10*6/uL (ref 3.70–5.32)
WBC: 9.3 10*3/uL (ref 3.9–10.0)

## 2011-02-22 LAB — RETICULOCYTES (CHCC): ABS Retic: 44.4 10*3/uL (ref 19.0–186.0)

## 2011-02-22 LAB — D-DIMER, QUANTITATIVE: D-Dimer, Quant: 0.29 ug/mL-FEU (ref 0.00–0.48)

## 2011-03-21 LAB — BASIC METABOLIC PANEL
CO2: 27 mEq/L (ref 19–32)
Calcium: 8.9 mg/dL (ref 8.4–10.5)
Chloride: 100 mEq/L (ref 96–112)
GFR calc Af Amer: >60 mL/min (ref >60)
Potassium: 3.1 mEq/L — ABNORMAL LOW (ref 3.5–5.1)
Sodium: 138 mEq/L (ref 135–145)

## 2011-03-21 LAB — DIFFERENTIAL
Basophils Absolute: 0 K/uL (ref 0.0–0.1)
Basophils Relative: 0 % (ref 0–1)
Eosinophils Absolute: 0.6 K/uL (ref 0.0–0.7)
Eosinophils Relative: 6 % — ABNORMAL HIGH (ref 0–5)
Lymphocytes Relative: 22 % (ref 12–46)
Lymphs Abs: 2 K/uL (ref 0.7–4.0)
Monocytes Absolute: 0.6 K/uL (ref 0.1–1.0)
Monocytes Relative: 6 % (ref 3–12)
Neutro Abs: 6.2 K/uL (ref 1.7–7.7)
Neutrophils Relative %: 66 % (ref 43–77)

## 2011-03-21 LAB — CBC
HCT: 36.1 % (ref 36.0–46.0)
Hemoglobin: 12 g/dL (ref 12.0–15.0)
MCHC: 33.3 g/dL (ref 30.0–36.0)
MCV: 79.7 fL (ref 78.0–100.0)
RBC: 4.52 MIL/uL (ref 3.87–5.11)

## 2011-03-21 LAB — PROTIME-INR
INR: 2.22 — ABNORMAL HIGH (ref 0.00–1.49)
Prothrombin Time: 24.4 seconds — ABNORMAL HIGH (ref 11.6–15.2)

## 2011-03-21 LAB — GLUCOSE, CAPILLARY: Glucose-Capillary: 119 mg/dL — ABNORMAL HIGH (ref 70–99)

## 2011-03-21 LAB — APTT

## 2011-03-27 LAB — CBC
MCHC: 34 g/dL (ref 30.0–36.0)
MCV: 82.2 fL (ref 78.0–100.0)
RBC: 4.3 MIL/uL (ref 3.87–5.11)

## 2011-03-27 LAB — DIFFERENTIAL
Basophils Relative: 1 % (ref 0–1)
Eosinophils Absolute: 0.8 10*3/uL — ABNORMAL HIGH (ref 0.0–0.7)
Eosinophils Relative: 8 % — ABNORMAL HIGH (ref 0–5)
Monocytes Relative: 6 % (ref 3–12)
Neutrophils Relative %: 63 % (ref 43–77)

## 2011-03-27 LAB — BONE MARROW EXAM

## 2011-03-29 LAB — BASIC METABOLIC PANEL
BUN: 8 mg/dL (ref 6–23)
CO2: 25 mEq/L (ref 19–32)
Chloride: 99 mEq/L (ref 96–112)
GFR calc non Af Amer: >60 mL/min (ref >60)
Glucose, Bld: 122 mg/dL — ABNORMAL HIGH (ref 70–99)
Potassium: 3.1 mEq/L — ABNORMAL LOW (ref 3.5–5.1)

## 2011-03-29 LAB — GLUCOSE, CAPILLARY
Glucose-Capillary: 101 mg/dL — ABNORMAL HIGH (ref 70–99)
Glucose-Capillary: 121 mg/dL — ABNORMAL HIGH (ref 70–99)
Glucose-Capillary: 142 mg/dL — ABNORMAL HIGH (ref 70–99)
Glucose-Capillary: 144 mg/dL — ABNORMAL HIGH (ref 70–99)
Glucose-Capillary: 144 mg/dL — ABNORMAL HIGH (ref 70–99)
Glucose-Capillary: 90 mg/dL (ref 70–99)

## 2011-03-29 LAB — CBC
HCT: 37.4 % (ref 36.0–46.0)
HCT: 39.4 % (ref 36.0–46.0)
Hemoglobin: 12.7 g/dL (ref 12.0–15.0)
MCHC: 34.5 g/dL (ref 30.0–36.0)
MCHC: 35 g/dL (ref 30.0–36.0)
MCV: 85.1 fL (ref 78.0–100.0)
MCV: 85.5 fL (ref 78.0–100.0)
Platelets: 189 10*3/uL (ref 150–400)
Platelets: 235 10*3/uL (ref 150–400)
RBC: 3.79 MIL/uL — ABNORMAL LOW (ref 3.87–5.11)
RBC: 4.29 MIL/uL (ref 3.87–5.11)
RDW: 14.7 % (ref 11.5–15.5)
RDW: 14.9 % (ref 11.5–15.5)
RDW: 15.1 % (ref 11.5–15.5)
WBC: 7.4 10*3/uL (ref 4.0–10.5)
WBC: 8.3 10*3/uL (ref 4.0–10.5)

## 2011-03-29 LAB — COMPREHENSIVE METABOLIC PANEL
AST: 23 U/L (ref 0–37)
Albumin: 3.4 g/dL — ABNORMAL LOW (ref 3.5–5.2)
BUN: 6 mg/dL (ref 6–23)
CO2: 24 mEq/L (ref 19–32)
Chloride: 104 mEq/L (ref 96–112)
Chloride: 108 mEq/L (ref 96–112)
Creatinine, Ser: 0.59 mg/dL (ref 0.4–1.2)
Creatinine, Ser: 0.6 mg/dL (ref 0.4–1.2)
GFR calc Af Amer: >60 mL/min (ref >60)
Potassium: 3.1 mEq/L — ABNORMAL LOW (ref 3.5–5.1)
Total Bilirubin: 0.9 mg/dL (ref 0.3–1.2)
Total Bilirubin: 1 mg/dL (ref 0.3–1.2)
Total Protein: 6.1 g/dL (ref 6.0–8.3)

## 2011-03-29 LAB — CARDIAC PANEL(CRET KIN+CKTOT+MB+TROPI)
CK, MB: 0.2 ng/mL — ABNORMAL LOW (ref 0.3–4.0)
CK, MB: 0.2 ng/mL — ABNORMAL LOW (ref 0.3–4.0)
Relative Index: INVALID (ref 0.0–2.5)
Relative Index: INVALID (ref 0.0–2.5)
Relative Index: INVALID (ref 0.0–2.5)
Total CK: 13 U/L (ref 7–177)
Total CK: 13 U/L (ref 7–177)
Troponin I: <0.01 ng/mL (ref 0.00–0.06)
Troponin I: <0.01 ng/mL (ref 0.00–0.06)

## 2011-03-29 LAB — DIFFERENTIAL
Basophils Absolute: 0.1 10*3/uL (ref 0.0–0.1)
Eosinophils Absolute: 1.9 10*3/uL — ABNORMAL HIGH (ref 0.0–0.7)
Eosinophils Relative: 20 % — ABNORMAL HIGH (ref 0–5)
Eosinophils Relative: 22 % — ABNORMAL HIGH (ref 0–5)
Eosinophils Relative: 24 % — ABNORMAL HIGH (ref 0–5)
Lymphocytes Relative: 15 % (ref 12–46)
Lymphocytes Relative: 26 % (ref 12–46)
Lymphocytes Relative: 31 % (ref 12–46)
Lymphs Abs: 1.3 10*3/uL (ref 0.7–4.0)
Lymphs Abs: 2.2 10*3/uL (ref 0.7–4.0)
Monocytes Absolute: 0.3 10*3/uL (ref 0.1–1.0)
Monocytes Absolute: 0.4 10*3/uL (ref 0.1–1.0)
Monocytes Absolute: 0.5 10*3/uL (ref 0.1–1.0)
Monocytes Relative: 4 % (ref 3–12)
Monocytes Relative: 5 % (ref 3–12)
Monocytes Relative: 6 % (ref 3–12)
Neutro Abs: 3.1 10*3/uL (ref 1.7–7.7)

## 2011-03-29 LAB — APTT: aPTT: 49 seconds — ABNORMAL HIGH (ref 24–37)

## 2011-03-29 LAB — LUPUS ANTICOAGULANT PANEL
Lupus Anticoagulant: NOT DETECTED
PTT Lupus Anticoagulant: 60.2 secs — ABNORMAL HIGH (ref 36.3–48.8)

## 2011-03-29 LAB — HEMOGLOBIN A1C
Hgb A1c MFr Bld: 7 % — ABNORMAL HIGH (ref 4.6–6.1)
Mean Plasma Glucose: 154 mg/dL

## 2011-03-29 LAB — PROTEIN C ACTIVITY: Protein C Activity: 26 % — ABNORMAL LOW (ref 75–133)

## 2011-03-29 LAB — PROTEIN S ACTIVITY: Protein S Activity: 46 % — ABNORMAL LOW (ref 69–129)

## 2011-03-29 LAB — FACTOR 5 LEIDEN

## 2011-03-29 LAB — TROPONIN I: Troponin I: <0.01 ng/mL (ref 0.00–0.06)

## 2011-03-29 LAB — ANTITHROMBIN III: AntiThromb III Func: 110 % (ref 76–126)

## 2011-03-29 LAB — CARDIOLIPIN ANTIBODIES, IGG, IGM, IGA: Anticardiolipin IgG: <7 [GPL'U] — ABNORMAL LOW (ref <11)

## 2011-03-29 LAB — PROTIME-INR: Prothrombin Time: 23.1 seconds — ABNORMAL HIGH (ref 11.6–15.2)

## 2011-03-30 LAB — CBC
HCT: 35.5 % — ABNORMAL LOW (ref 36.0–46.0)
Hemoglobin: 12.4 g/dL (ref 12.0–15.0)
MCHC: 34.9 g/dL (ref 30.0–36.0)
MCV: 86.4 fL (ref 78.0–100.0)
Platelets: 214 K/uL (ref 150–400)
RBC: 4.11 MIL/uL (ref 3.87–5.11)
RDW: 16.1 % — ABNORMAL HIGH (ref 11.5–15.5)
WBC: 13.1 10*3/uL — ABNORMAL HIGH (ref 4.0–10.5)

## 2011-03-30 LAB — POCT I-STAT, CHEM 8
BUN: 17 mg/dL (ref 6–23)
Calcium, Ion: 1.1 mmol/L — ABNORMAL LOW (ref 1.12–1.32)
Chloride: 103 mEq/L (ref 96–112)
Creatinine, Ser: 0.9 mg/dL (ref 0.4–1.2)
Glucose, Bld: 145 mg/dL — ABNORMAL HIGH (ref 70–99)
HCT: 37 % (ref 36.0–46.0)
Hemoglobin: 12.6 g/dL (ref 12.0–15.0)
Potassium: 3.6 mEq/L (ref 3.5–5.1)
Sodium: 140 mEq/L (ref 135–145)
TCO2: 26 mmol/L (ref 0–100)

## 2011-03-30 LAB — DIFFERENTIAL
Basophils Absolute: 0.1 K/uL (ref 0.0–0.1)
Basophils Relative: 1 % (ref 0–1)
Eosinophils Absolute: 3.8 K/uL — ABNORMAL HIGH (ref 0.0–0.7)
Eosinophils Relative: 29 % — ABNORMAL HIGH (ref 0–5)
Lymphocytes Relative: 18 % (ref 12–46)
Lymphs Abs: 2.4 10*3/uL (ref 0.7–4.0)
Monocytes Absolute: 0.7 K/uL (ref 0.1–1.0)
Monocytes Relative: 5 % (ref 3–12)
Neutro Abs: 6.1 10*3/uL (ref 1.7–7.7)
Neutrophils Relative %: 47 % (ref 43–77)

## 2011-03-30 LAB — D-DIMER, QUANTITATIVE
D-Dimer, Quant: 2.41 ug/mL-FEU — ABNORMAL HIGH (ref 0.00–0.48)
D-Dimer, Quant: 2.78 ug{FEU}/mL — ABNORMAL HIGH (ref 0.00–0.48)

## 2011-03-30 LAB — PROTIME-INR
INR: 1.1 (ref 0.00–1.49)
Prothrombin Time: 14.5 s (ref 11.6–15.2)

## 2011-03-30 LAB — APTT: aPTT: 28 seconds (ref 24–37)

## 2011-04-03 LAB — CBC
HCT: 28.7 % — ABNORMAL LOW (ref 36.0–46.0)
HCT: 28.9 % — ABNORMAL LOW (ref 36.0–46.0)
HCT: 29.9 % — ABNORMAL LOW (ref 36.0–46.0)
HCT: 21.3 % — ABNORMAL LOW (ref 36.0–46.0)
HCT: 22.9 % — ABNORMAL LOW (ref 36.0–46.0)
HCT: 23.2 % — ABNORMAL LOW (ref 36.0–46.0)
HCT: 24.7 % — ABNORMAL LOW (ref 36.0–46.0)
HCT: 24.8 % — ABNORMAL LOW (ref 36.0–46.0)
HCT: 25.5 % — ABNORMAL LOW (ref 36.0–46.0)
HCT: 25.9 % — ABNORMAL LOW (ref 36.0–46.0)
Hemoglobin: 12.3 g/dL (ref 12.0–15.0)
Hemoglobin: 9.5 g/dL — ABNORMAL LOW (ref 12.0–15.0)
Hemoglobin: 9.7 g/dL — ABNORMAL LOW (ref 12.0–15.0)
Hemoglobin: 6.3 g/dL — CL (ref 12.0–15.0)
Hemoglobin: 7.7 g/dL — CL (ref 12.0–15.0)
Hemoglobin: 8.6 g/dL — ABNORMAL LOW (ref 12.0–15.0)
Hemoglobin: 8.6 g/dL — ABNORMAL LOW (ref 12.0–15.0)
MCHC: 32.8 g/dL (ref 30.0–36.0)
MCHC: 33.2 g/dL (ref 30.0–36.0)
MCHC: 33.4 g/dL (ref 30.0–36.0)
MCHC: 32.8 g/dL (ref 30.0–36.0)
MCHC: 33.1 g/dL (ref 30.0–36.0)
MCHC: 33.5 g/dL (ref 30.0–36.0)
MCHC: 33.6 g/dL (ref 30.0–36.0)
MCHC: 33.7 g/dL (ref 30.0–36.0)
MCHC: 33.7 g/dL (ref 30.0–36.0)
MCHC: 35.9 g/dL (ref 30.0–36.0)
MCHC: 36.1 g/dL — ABNORMAL HIGH (ref 30.0–36.0)
MCV: 80 fL (ref 78.0–100.0)
MCV: 80.3 fL (ref 78.0–100.0)
MCV: 80.4 fL (ref 78.0–100.0)
MCV: 80.5 fL (ref 78.0–100.0)
MCV: 81.5 fL (ref 78.0–100.0)
MCV: 81.8 fL (ref 78.0–100.0)
MCV: 82.7 fL (ref 78.0–100.0)
MCV: 84 fL (ref 78.0–100.0)
MCV: 88 fL (ref 78.0–100.0)
MCV: 88.2 fL (ref 78.0–100.0)
MCV: 94 fL (ref 78.0–100.0)
Platelets: 204 10*3/uL (ref 150–400)
Platelets: 243 10*3/uL (ref 150–400)
Platelets: 281 10*3/uL (ref 150–400)
Platelets: 370 10*3/uL (ref 150–400)
Platelets: 195 10*3/uL (ref 150–400)
Platelets: 213 10*3/uL (ref 150–400)
Platelets: 218 10*3/uL (ref 150–400)
Platelets: 227 10*3/uL (ref 150–400)
Platelets: 230 10*3/uL (ref 150–400)
Platelets: 237 10*3/uL (ref 150–400)
Platelets: 243 10*3/uL (ref 150–400)
Platelets: 249 K/uL (ref 150–400)
RBC: 3.54 MIL/uL — ABNORMAL LOW (ref 3.87–5.11)
RBC: 3.57 MIL/uL — ABNORMAL LOW (ref 3.87–5.11)
RBC: 3.65 MIL/uL — ABNORMAL LOW (ref 3.87–5.11)
RBC: 3.71 MIL/uL — ABNORMAL LOW (ref 3.87–5.11)
RBC: 4.6 MIL/uL (ref 3.87–5.11)
RBC: 2.26 MIL/uL — ABNORMAL LOW (ref 3.87–5.11)
RBC: 2.37 MIL/uL — ABNORMAL LOW (ref 3.87–5.11)
RBC: 2.53 MIL/uL — ABNORMAL LOW (ref 3.87–5.11)
RBC: 2.73 MIL/uL — ABNORMAL LOW (ref 3.87–5.11)
RBC: 2.95 MIL/uL — ABNORMAL LOW (ref 3.87–5.11)
RBC: 3.01 MIL/uL — ABNORMAL LOW (ref 3.87–5.11)
RBC: 3.13 MIL/uL — ABNORMAL LOW (ref 3.87–5.11)
RDW: 14.6 % (ref 11.5–15.5)
RDW: 14.8 % (ref 11.5–15.5)
RDW: 14.8 % (ref 11.5–15.5)
RDW: 14.9 % (ref 11.5–15.5)
RDW: 15 % (ref 11.5–15.5)
RDW: 16.8 % — ABNORMAL HIGH (ref 11.5–15.5)
RDW: 16.9 % — ABNORMAL HIGH (ref 11.5–15.5)
RDW: 20.2 % — ABNORMAL HIGH (ref 11.5–15.5)
RDW: 20.6 % — ABNORMAL HIGH (ref 11.5–15.5)
RDW: 20.9 % — ABNORMAL HIGH (ref 11.5–15.5)
RDW: 21.6 % — ABNORMAL HIGH (ref 11.5–15.5)
WBC: 11.4 10*3/uL — ABNORMAL HIGH (ref 4.0–10.5)
WBC: 13 10*3/uL — ABNORMAL HIGH (ref 4.0–10.5)
WBC: 14.2 10*3/uL — ABNORMAL HIGH (ref 4.0–10.5)
WBC: 14.2 10*3/uL — ABNORMAL HIGH (ref 4.0–10.5)
WBC: 15.8 10*3/uL — ABNORMAL HIGH (ref 4.0–10.5)
WBC: 13.7 10*3/uL — ABNORMAL HIGH (ref 4.0–10.5)
WBC: 15.3 10*3/uL — ABNORMAL HIGH (ref 4.0–10.5)
WBC: 16.2 K/uL — ABNORMAL HIGH (ref 4.0–10.5)
WBC: 24 10*3/uL — ABNORMAL HIGH (ref 4.0–10.5)
WBC: 24.5 10*3/uL — ABNORMAL HIGH (ref 4.0–10.5)
WBC: 24.6 10*3/uL — ABNORMAL HIGH (ref 4.0–10.5)

## 2011-04-03 LAB — GLUCOSE, CAPILLARY
Glucose-Capillary: 112 mg/dL — ABNORMAL HIGH (ref 70–99)
Glucose-Capillary: 116 mg/dL — ABNORMAL HIGH (ref 70–99)
Glucose-Capillary: 118 mg/dL — ABNORMAL HIGH (ref 70–99)
Glucose-Capillary: 121 mg/dL — ABNORMAL HIGH (ref 70–99)
Glucose-Capillary: 136 mg/dL — ABNORMAL HIGH (ref 70–99)
Glucose-Capillary: 136 mg/dL — ABNORMAL HIGH (ref 70–99)
Glucose-Capillary: 176 mg/dL — ABNORMAL HIGH (ref 70–99)
Glucose-Capillary: 180 mg/dL — ABNORMAL HIGH (ref 70–99)
Glucose-Capillary: 60 mg/dL — ABNORMAL LOW (ref 70–99)
Glucose-Capillary: 77 mg/dL (ref 70–99)
Glucose-Capillary: 82 mg/dL (ref 70–99)
Glucose-Capillary: 83 mg/dL (ref 70–99)
Glucose-Capillary: 99 mg/dL (ref 70–99)
Glucose-Capillary: 101 mg/dL — ABNORMAL HIGH (ref 70–99)
Glucose-Capillary: 102 mg/dL — ABNORMAL HIGH (ref 70–99)
Glucose-Capillary: 103 mg/dL — ABNORMAL HIGH (ref 70–99)
Glucose-Capillary: 104 mg/dL — ABNORMAL HIGH (ref 70–99)
Glucose-Capillary: 106 mg/dL — ABNORMAL HIGH (ref 70–99)
Glucose-Capillary: 106 mg/dL — ABNORMAL HIGH (ref 70–99)
Glucose-Capillary: 107 mg/dL — ABNORMAL HIGH (ref 70–99)
Glucose-Capillary: 109 mg/dL — ABNORMAL HIGH (ref 70–99)
Glucose-Capillary: 110 mg/dL — ABNORMAL HIGH (ref 70–99)
Glucose-Capillary: 111 mg/dL — ABNORMAL HIGH (ref 70–99)
Glucose-Capillary: 113 mg/dL — ABNORMAL HIGH (ref 70–99)
Glucose-Capillary: 114 mg/dL — ABNORMAL HIGH (ref 70–99)
Glucose-Capillary: 114 mg/dL — ABNORMAL HIGH (ref 70–99)
Glucose-Capillary: 118 mg/dL — ABNORMAL HIGH (ref 70–99)
Glucose-Capillary: 118 mg/dL — ABNORMAL HIGH (ref 70–99)
Glucose-Capillary: 119 mg/dL — ABNORMAL HIGH (ref 70–99)
Glucose-Capillary: 119 mg/dL — ABNORMAL HIGH (ref 70–99)
Glucose-Capillary: 119 mg/dL — ABNORMAL HIGH (ref 70–99)
Glucose-Capillary: 121 mg/dL — ABNORMAL HIGH (ref 70–99)
Glucose-Capillary: 123 mg/dL — ABNORMAL HIGH (ref 70–99)
Glucose-Capillary: 124 mg/dL — ABNORMAL HIGH (ref 70–99)
Glucose-Capillary: 126 mg/dL — ABNORMAL HIGH (ref 70–99)
Glucose-Capillary: 126 mg/dL — ABNORMAL HIGH (ref 70–99)
Glucose-Capillary: 126 mg/dL — ABNORMAL HIGH (ref 70–99)
Glucose-Capillary: 131 mg/dL — ABNORMAL HIGH (ref 70–99)
Glucose-Capillary: 132 mg/dL — ABNORMAL HIGH (ref 70–99)
Glucose-Capillary: 135 mg/dL — ABNORMAL HIGH (ref 70–99)
Glucose-Capillary: 137 mg/dL — ABNORMAL HIGH (ref 70–99)
Glucose-Capillary: 141 mg/dL — ABNORMAL HIGH (ref 70–99)
Glucose-Capillary: 142 mg/dL — ABNORMAL HIGH (ref 70–99)
Glucose-Capillary: 144 mg/dL — ABNORMAL HIGH (ref 70–99)
Glucose-Capillary: 157 mg/dL — ABNORMAL HIGH (ref 70–99)
Glucose-Capillary: 157 mg/dL — ABNORMAL HIGH (ref 70–99)
Glucose-Capillary: 160 mg/dL — ABNORMAL HIGH (ref 70–99)
Glucose-Capillary: 167 mg/dL — ABNORMAL HIGH (ref 70–99)
Glucose-Capillary: 178 mg/dL — ABNORMAL HIGH (ref 70–99)
Glucose-Capillary: 205 mg/dL — ABNORMAL HIGH (ref 70–99)
Glucose-Capillary: 231 mg/dL — ABNORMAL HIGH (ref 70–99)
Glucose-Capillary: 243 mg/dL — ABNORMAL HIGH (ref 70–99)
Glucose-Capillary: 249 mg/dL — ABNORMAL HIGH (ref 70–99)
Glucose-Capillary: 254 mg/dL — ABNORMAL HIGH (ref 70–99)
Glucose-Capillary: 276 mg/dL — ABNORMAL HIGH (ref 70–99)
Glucose-Capillary: 281 mg/dL — ABNORMAL HIGH (ref 70–99)
Glucose-Capillary: 299 mg/dL — ABNORMAL HIGH (ref 70–99)
Glucose-Capillary: 305 mg/dL — ABNORMAL HIGH (ref 70–99)
Glucose-Capillary: 307 mg/dL — ABNORMAL HIGH (ref 70–99)
Glucose-Capillary: 307 mg/dL — ABNORMAL HIGH (ref 70–99)
Glucose-Capillary: 89 mg/dL (ref 70–99)
Glucose-Capillary: 94 mg/dL (ref 70–99)
Glucose-Capillary: 95 mg/dL (ref 70–99)
Glucose-Capillary: 96 mg/dL (ref 70–99)
Glucose-Capillary: 97 mg/dL (ref 70–99)
Glucose-Capillary: 99 mg/dL (ref 70–99)

## 2011-04-03 LAB — CROSSMATCH: Antibody Screen: POSITIVE

## 2011-04-03 LAB — RETICULOCYTES
RBC.: 2.66 MIL/uL — ABNORMAL LOW (ref 3.87–5.11)
RBC.: 2.96 MIL/uL — ABNORMAL LOW (ref 3.87–5.11)
Retic Count, Absolute: 98.3 10*3/uL (ref 19.0–186.0)
Retic Count, Absolute: 107.1 10*3/uL (ref 19.0–186.0)
Retic Count, Absolute: 153.9 10*3/uL (ref 19.0–186.0)
Retic Count, Absolute: 164.9 10*3/uL (ref 19.0–186.0)
Retic Ct Pct: 2.9 % (ref 0.4–3.1)
Retic Ct Pct: 5.2 % — ABNORMAL HIGH (ref 0.4–3.1)
Retic Ct Pct: 6.2 % — ABNORMAL HIGH (ref 0.4–3.1)

## 2011-04-03 LAB — DIFFERENTIAL
Basophils Absolute: 0 10*3/uL (ref 0.0–0.1)
Basophils Absolute: 0 10*3/uL (ref 0.0–0.1)
Basophils Absolute: 0 10*3/uL (ref 0.0–0.1)
Basophils Absolute: 0 10*3/uL (ref 0.0–0.1)
Basophils Absolute: 0 10*3/uL (ref 0.0–0.1)
Basophils Absolute: 0 10*3/uL (ref 0.0–0.1)
Basophils Absolute: 0 10*3/uL (ref 0.0–0.1)
Basophils Absolute: 0 K/uL (ref 0.0–0.1)
Basophils Absolute: 0.1 10*3/uL (ref 0.0–0.1)
Basophils Absolute: 0.2 10*3/uL — ABNORMAL HIGH (ref 0.0–0.1)
Basophils Relative: 0 % (ref 0–1)
Basophils Relative: 0 % (ref 0–1)
Basophils Relative: 0 % (ref 0–1)
Basophils Relative: 0 % (ref 0–1)
Basophils Relative: 1 % (ref 0–1)
Eosinophils Absolute: 3.6 10*3/uL — ABNORMAL HIGH (ref 0.0–0.7)
Eosinophils Absolute: 4 10*3/uL — ABNORMAL HIGH (ref 0.0–0.7)
Eosinophils Absolute: 12.8 10*3/uL — ABNORMAL HIGH (ref 0.0–0.7)
Eosinophils Absolute: 6.6 K/uL — ABNORMAL HIGH (ref 0.0–0.7)
Eosinophils Absolute: 7 10*3/uL — ABNORMAL HIGH (ref 0.0–0.7)
Eosinophils Relative: 44 % — ABNORMAL HIGH (ref 0–5)
Eosinophils Relative: 35 % — ABNORMAL HIGH (ref 0–5)
Eosinophils Relative: 41 % — ABNORMAL HIGH (ref 0–5)
Eosinophils Relative: 51 % — ABNORMAL HIGH (ref 0–5)
Eosinophils Relative: 59 % — ABNORMAL HIGH (ref 0–5)
Lymphocytes Relative: 10 % — ABNORMAL LOW (ref 12–46)
Lymphocytes Relative: 11 % — ABNORMAL LOW (ref 12–46)
Lymphocytes Relative: 12 % (ref 12–46)
Lymphocytes Relative: 10 % — ABNORMAL LOW (ref 12–46)
Lymphocytes Relative: 14 % (ref 12–46)
Lymphocytes Relative: 4 % — ABNORMAL LOW (ref 12–46)
Lymphocytes Relative: 9 % — ABNORMAL LOW (ref 12–46)
Lymphs Abs: 1.4 10*3/uL (ref 0.7–4.0)
Lymphs Abs: 0.6 K/uL — ABNORMAL LOW (ref 0.7–4.0)
Lymphs Abs: 1.4 10*3/uL (ref 0.7–4.0)
Lymphs Abs: 1.4 10*3/uL (ref 0.7–4.0)
Lymphs Abs: 2.4 10*3/uL (ref 0.7–4.0)
Lymphs Abs: 2.4 10*3/uL (ref 0.7–4.0)
Lymphs Abs: 2.6 10*3/uL (ref 0.7–4.0)
Monocytes Absolute: 0.4 10*3/uL (ref 0.1–1.0)
Monocytes Absolute: 0.5 10*3/uL (ref 0.1–1.0)
Monocytes Absolute: 0.5 K/uL (ref 0.1–1.0)
Monocytes Absolute: 0.6 10*3/uL (ref 0.1–1.0)
Monocytes Relative: 3 % (ref 3–12)
Monocytes Relative: 3 % (ref 3–12)
Monocytes Relative: 3 % (ref 3–12)
Monocytes Relative: 2 % — ABNORMAL LOW (ref 3–12)
Monocytes Relative: 3 % (ref 3–12)
Monocytes Relative: 3 % (ref 3–12)
Monocytes Relative: 3 % (ref 3–12)
Monocytes Relative: 5 % (ref 3–12)
Monocytes Relative: 7 % (ref 3–12)
Neutro Abs: 6.5 10*3/uL (ref 1.7–7.7)
Neutro Abs: 7.1 10*3/uL (ref 1.7–7.7)
Neutro Abs: 4 10*3/uL (ref 1.7–7.7)
Neutro Abs: 4.7 10*3/uL (ref 1.7–7.7)
Neutro Abs: 7 10*3/uL (ref 1.7–7.7)
Neutro Abs: 8.5 K/uL — ABNORMAL HIGH (ref 1.7–7.7)
Neutro Abs: 8.8 10*3/uL — ABNORMAL HIGH (ref 1.7–7.7)
Neutrophils Relative %: 46 % (ref 43–77)
Neutrophils Relative %: 58 % (ref 43–77)
Neutrophils Relative %: 59 % (ref 43–77)
Neutrophils Relative %: 34 % — ABNORMAL LOW (ref 43–77)
Neutrophils Relative %: 36 % — ABNORMAL LOW (ref 43–77)
Neutrophils Relative %: 52 % (ref 43–77)
Neutrophils Relative %: 64 % (ref 43–77)

## 2011-04-03 LAB — COMPREHENSIVE METABOLIC PANEL
ALT: 19 U/L (ref 0–35)
ALT: 39 U/L — ABNORMAL HIGH (ref 0–35)
ALT: 43 U/L — ABNORMAL HIGH (ref 0–35)
AST: 32 U/L (ref 0–37)
AST: 50 U/L — ABNORMAL HIGH (ref 0–37)
Albumin: 2 g/dL — ABNORMAL LOW (ref 3.5–5.2)
Alkaline Phosphatase: 69 U/L (ref 39–117)
CO2: 30 mEq/L (ref 19–32)
CO2: 26 mEq/L (ref 19–32)
CO2: 27 mEq/L (ref 19–32)
Calcium: 7.1 mg/dL — ABNORMAL LOW (ref 8.4–10.5)
Chloride: 103 mEq/L (ref 96–112)
Creatinine, Ser: 0.56 mg/dL (ref 0.4–1.2)
Creatinine, Ser: 0.58 mg/dL (ref 0.4–1.2)
GFR calc Af Amer: >60 mL/min (ref >60)
GFR calc Af Amer: >60 mL/min (ref >60)
GFR calc non Af Amer: >60 mL/min (ref >60)
GFR calc non Af Amer: >60 mL/min (ref >60)
Glucose, Bld: 207 mg/dL — ABNORMAL HIGH (ref 70–99)
Potassium: 3.1 mEq/L — ABNORMAL LOW (ref 3.5–5.1)
Sodium: 135 mEq/L (ref 135–145)
Sodium: 134 mEq/L — ABNORMAL LOW (ref 135–145)
Sodium: 140 mEq/L (ref 135–145)
Total Bilirubin: 2.5 mg/dL — ABNORMAL HIGH (ref 0.3–1.2)
Total Protein: 6.5 g/dL (ref 6.0–8.3)
Total Protein: 4.8 g/dL — ABNORMAL LOW (ref 6.0–8.3)

## 2011-04-03 LAB — CARDIAC PANEL(CRET KIN+CKTOT+MB+TROPI)
CK, MB: 0.3 ng/mL (ref 0.3–4.0)
CK, MB: 0.3 ng/mL (ref 0.3–4.0)
Relative Index: 0.3 (ref 0.0–2.5)
Relative Index: INVALID (ref 0.0–2.5)
Total CK: 97 U/L (ref 7–177)
Troponin I: <0.01 ng/mL (ref 0.00–0.06)

## 2011-04-03 LAB — URINE CULTURE
Colony Count: 2000
Colony Count: 40000
Colony Count: NO GROWTH
Special Requests: NEGATIVE

## 2011-04-03 LAB — BASIC METABOLIC PANEL
BUN: 1 mg/dL — ABNORMAL LOW (ref 6–23)
BUN: 2 mg/dL — ABNORMAL LOW (ref 6–23)
BUN: 3 mg/dL — ABNORMAL LOW (ref 6–23)
CO2: 26 mEq/L (ref 19–32)
CO2: 27 mEq/L (ref 19–32)
CO2: 25 mEq/L (ref 19–32)
CO2: 26 mEq/L (ref 19–32)
Calcium: 8.2 mg/dL — ABNORMAL LOW (ref 8.4–10.5)
Chloride: 102 mEq/L (ref 96–112)
Chloride: 102 mEq/L (ref 96–112)
Chloride: 106 mEq/L (ref 96–112)
Chloride: 103 mEq/L (ref 96–112)
Chloride: 108 mEq/L (ref 96–112)
Creatinine, Ser: 0.58 mg/dL (ref 0.4–1.2)
Creatinine, Ser: 0.6 mg/dL (ref 0.4–1.2)
Creatinine, Ser: 0.56 mg/dL (ref 0.4–1.2)
GFR calc Af Amer: >60 mL/min (ref >60)
GFR calc Af Amer: >60 mL/min (ref >60)
GFR calc Af Amer: >60 mL/min (ref >60)
GFR calc Af Amer: >60 mL/min (ref >60)
GFR calc non Af Amer: >60 mL/min (ref >60)
GFR calc non Af Amer: >60 mL/min (ref >60)
GFR calc non Af Amer: >60 mL/min (ref >60)
Glucose, Bld: 112 mg/dL — ABNORMAL HIGH (ref 70–99)
Glucose, Bld: 140 mg/dL — ABNORMAL HIGH (ref 70–99)
Glucose, Bld: 119 mg/dL — ABNORMAL HIGH (ref 70–99)
Potassium: 2.6 mEq/L — CL (ref 3.5–5.1)
Potassium: 2.9 mEq/L — ABNORMAL LOW (ref 3.5–5.1)
Potassium: 3.5 mEq/L (ref 3.5–5.1)
Potassium: 3.7 mEq/L (ref 3.5–5.1)
Potassium: 3.2 mEq/L — ABNORMAL LOW (ref 3.5–5.1)
Potassium: 3.4 mEq/L — ABNORMAL LOW (ref 3.5–5.1)
Potassium: 4.4 mEq/L (ref 3.5–5.1)
Sodium: 136 mEq/L (ref 135–145)
Sodium: 136 mEq/L (ref 135–145)
Sodium: 138 mEq/L (ref 135–145)
Sodium: 133 mEq/L — ABNORMAL LOW (ref 135–145)

## 2011-04-03 LAB — URINALYSIS, ROUTINE W REFLEX MICROSCOPIC
Bilirubin Urine: NEGATIVE
Glucose, UA: NEGATIVE mg/dL
Glucose, UA: NEGATIVE mg/dL
Hgb urine dipstick: NEGATIVE
Ketones, ur: 15 mg/dL — AB
Leukocytes, UA: NEGATIVE
Nitrite: NEGATIVE
Nitrite: POSITIVE — AB
Protein, ur: 30 mg/dL — AB
Protein, ur: 100 mg/dL — AB
Specific Gravity, Urine: 1.004 — ABNORMAL LOW (ref 1.005–1.030)
Specific Gravity, Urine: 1.029 (ref 1.005–1.030)
Specific Gravity, Urine: 1.042 — ABNORMAL HIGH (ref 1.005–1.030)
Urobilinogen, UA: 0.2 mg/dL (ref 0.0–1.0)
Urobilinogen, UA: 1 mg/dL (ref 0.0–1.0)
Urobilinogen, UA: 1 mg/dL (ref 0.0–1.0)
pH: 6.5 (ref 5.0–8.0)
pH: 5 (ref 5.0–8.0)

## 2011-04-03 LAB — BASIC METABOLIC PANEL WITH GFR
BUN: 7 mg/dL (ref 6–23)
CO2: 25 meq/L (ref 19–32)
Chloride: 99 meq/L (ref 96–112)
Creatinine, Ser: 0.68 mg/dL (ref 0.4–1.2)
Glucose, Bld: 167 mg/dL — ABNORMAL HIGH (ref 70–99)

## 2011-04-03 LAB — GIARDIA/CRYPTOSPORIDIUM SCREEN(EIA)
Cryptosporidium Screen (EIA): NEGATIVE
Giardia Screen - EIA: NEGATIVE

## 2011-04-03 LAB — FOLATE
Folate: 8.2 ng/mL
Folate: 2.8 ng/mL

## 2011-04-03 LAB — HEMOGLOBIN AND HEMATOCRIT, BLOOD: Hemoglobin: 7.3 g/dL — CL (ref 12.0–15.0)

## 2011-04-03 LAB — HEMOCCULT GUIAC POC 1CARD (OFFICE)
Fecal Occult Bld: POSITIVE
Fecal Occult Bld: NEGATIVE
Fecal Occult Bld: NEGATIVE
Fecal Occult Bld: NEGATIVE
Fecal Occult Bld: POSITIVE

## 2011-04-03 LAB — LACTATE DEHYDROGENASE: LDH: 418 U/L — ABNORMAL HIGH (ref 94–250)

## 2011-04-03 LAB — STOOL CULTURE

## 2011-04-03 LAB — URINE MICROSCOPIC-ADD ON

## 2011-04-03 LAB — MISCELLANEOUS TEST

## 2011-04-03 LAB — CULTURE, BLOOD (ROUTINE X 2)
Culture: NO GROWTH
Culture: NO GROWTH

## 2011-04-03 LAB — FECAL LACTOFERRIN, QUANT: Fecal Lactoferrin: POSITIVE

## 2011-04-03 LAB — CLOSTRIDIUM DIFFICILE EIA: C difficile Toxins A+B, EIA: NEGATIVE

## 2011-04-03 LAB — IRON AND TIBC: Saturation Ratios: 21 % (ref 20–55)

## 2011-04-03 LAB — EXPECTORATED SPUTUM ASSESSMENT W GRAM STAIN, RFLX TO RESP C

## 2011-04-03 LAB — PATHOLOGIST SMEAR REVIEW

## 2011-04-03 LAB — CHROMOSOME ANALYSIS, BONE MARROW

## 2011-04-03 LAB — MYCOPLASMA PNEUMONIAE ANTIBODY, IGM: Mycoplasma pneumo IgM: 526 Units/mL (ref <770)

## 2011-04-03 LAB — OVA AND PARASITE EXAMINATION: Ova and parasites: NONE SEEN

## 2011-04-03 LAB — BILIRUBIN, FRACTIONATED(TOT/DIR/INDIR)
Bilirubin, Direct: 1 mg/dL — ABNORMAL HIGH (ref 0.0–0.3)
Total Bilirubin: 2.7 mg/dL — ABNORMAL HIGH (ref 0.3–1.2)

## 2011-04-03 LAB — PROTIME-INR: Prothrombin Time: 18.5 seconds — ABNORMAL HIGH (ref 11.6–15.2)

## 2011-04-03 LAB — HEMOGLOBIN A1C
Hgb A1c MFr Bld: 7.5 % — ABNORMAL HIGH (ref 4.6–6.1)
Hgb A1c MFr Bld: 6.4 % — ABNORMAL HIGH (ref 4.6–6.1)

## 2011-04-03 LAB — HAPTOGLOBIN: Haptoglobin: 192 mg/dL (ref 16–200)

## 2011-04-03 LAB — VITAMIN B12: Vitamin B-12: 556 pg/mL (ref 211–911)

## 2011-04-03 LAB — LEGIONELLA ANTIGEN, URINE: Legionella Antigen, Urine: NEGATIVE

## 2011-04-03 LAB — EXTRACTABLE NUCLEAR ANTIGEN ANTIBODY
ENA SM Ab Ser-aCnc: <0.2 AI (ref <1.0)
SSA (Ro) (ENA) Antibody, IgG: <0.2 AI (ref <1.0)
SSB (La) (ENA) Antibody, IgG: <0.2 AI (ref <1.0)
Scleroderma (Scl-70) (ENA) Antibody, IgG: <0.2 AI (ref <1.0)

## 2011-04-03 LAB — PREPARE RBC (CROSSMATCH)

## 2011-04-03 LAB — MAGNESIUM: Magnesium: 2 mg/dL (ref 1.5–2.5)

## 2011-04-03 LAB — HIV ANTIBODY (ROUTINE TESTING W REFLEX): HIV: NONREACTIVE

## 2011-04-03 LAB — BONE MARROW EXAM

## 2011-04-04 LAB — PREGNANCY, URINE: Preg Test, Ur: NEGATIVE

## 2011-04-04 LAB — URINALYSIS, ROUTINE W REFLEX MICROSCOPIC
Glucose, UA: 250 mg/dL — AB
Ketones, ur: 15 mg/dL — AB
Nitrite: NEGATIVE
Protein, ur: >300 mg/dL — AB
Specific Gravity, Urine: 1.028 (ref 1.005–1.030)
Urobilinogen, UA: 1 mg/dL (ref 0.0–1.0)
pH: 5 (ref 5.0–8.0)

## 2011-04-04 LAB — DIFFERENTIAL
Basophils Absolute: 0 10*3/uL (ref 0.0–0.1)
Basophils Relative: 1 % (ref 0–1)
Basophils Relative: 0 % (ref 0–1)
Eosinophils Absolute: 6.1 10*3/uL — ABNORMAL HIGH (ref 0.0–0.7)
Eosinophils Absolute: 1.1 10*3/uL — ABNORMAL HIGH (ref 0.0–0.7)
Eosinophils Relative: 44 % — ABNORMAL HIGH (ref 0–5)
Eosinophils Relative: 10 % — ABNORMAL HIGH (ref 0–5)
Lymphocytes Relative: 21 % (ref 12–46)
Lymphocytes Relative: 27 % (ref 12–46)
Lymphs Abs: 2.9 10*3/uL (ref 0.7–4.0)
Monocytes Absolute: 0.1 10*3/uL (ref 0.1–1.0)
Monocytes Relative: 1 % — ABNORMAL LOW (ref 3–12)
Neutro Abs: 6.5 10*3/uL (ref 1.7–7.7)
Neutrophils Relative %: 30 % — ABNORMAL LOW (ref 43–77)
Neutrophils Relative %: 62 % (ref 43–77)

## 2011-04-04 LAB — CBC
HCT: 45.3 % (ref 36.0–46.0)
Hemoglobin: 8.2 g/dL — ABNORMAL LOW (ref 12.0–15.0)
Hemoglobin: 15.4 g/dL — ABNORMAL HIGH (ref 12.0–15.0)
MCHC: 34 g/dL (ref 30.0–36.0)
Platelets: 229 10*3/uL (ref 150–400)
RBC: 2.71 MIL/uL — ABNORMAL LOW (ref 3.87–5.11)
RDW: 22.1 % — ABNORMAL HIGH (ref 11.5–15.5)
RDW: 20 % — ABNORMAL HIGH (ref 11.5–15.5)

## 2011-04-04 LAB — POCT I-STAT, CHEM 8
BUN: 7 mg/dL (ref 6–23)
Calcium, Ion: 1.07 mmol/L — ABNORMAL LOW (ref 1.12–1.32)
Chloride: 101 mEq/L (ref 96–112)
Creatinine, Ser: 0.9 mg/dL (ref 0.4–1.2)
Glucose, Bld: 228 mg/dL — ABNORMAL HIGH (ref 70–99)
HCT: 49 % — ABNORMAL HIGH (ref 36.0–46.0)
Hemoglobin: 16.7 g/dL — ABNORMAL HIGH (ref 12.0–15.0)
Potassium: 2.8 mEq/L — ABNORMAL LOW (ref 3.5–5.1)
Sodium: 138 mEq/L (ref 135–145)
TCO2: 18 mmol/L (ref 0–100)

## 2011-04-04 LAB — URINE MICROSCOPIC-ADD ON

## 2011-04-04 LAB — RETICULOCYTES
RBC.: 2.82 MIL/uL — ABNORMAL LOW (ref 3.87–5.11)
Retic Count, Absolute: 174.8 10*3/uL (ref 19.0–186.0)
Retic Ct Pct: 6.2 % — ABNORMAL HIGH (ref 0.4–3.1)

## 2011-04-04 LAB — GLUCOSE, CAPILLARY
Glucose-Capillary: 113 mg/dL — ABNORMAL HIGH (ref 70–99)
Glucose-Capillary: 202 mg/dL — ABNORMAL HIGH (ref 70–99)

## 2011-05-02 NOTE — Discharge Summary (Signed)
NAMESHALONA, HARBOUR                ACCOUNT NO.:  [id number]   MEDICAL RECORD NO.:  [id number]          PATIENT TYPE:  INP   LOCATION:  3711                         FACILITY:  [facility]   PHYSICIAN:  Hollice Espy, M.D.DATE OF BIRTH:  06/20/1966   DATE OF ADMISSION:  03/26/2009  DATE OF DISCHARGE:  03/29/2009                               DISCHARGE SUMMARY   PRIMARY CARE PHYSICIAN:  L. Lupe Carney, MD   HEMATOLOGIST:  Rose Phi. Myna Hidalgo, MD   DISCHARGE DIAGNOSES:  1. 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  2. History of peripheral eosinophilia.  3. Obesity.  4. Diabetes mellitus.  5. Hypertension.   DISCHARGE MEDICATIONS:  The patient will continue all of her previous  medications:  1. Metformin 500 mg p.o. t.i.d.  2. Quinapril 20/25 p.o. daily.  3. Flonase 80 mcg 1 spray as needed.  4. Glucotrol 5 p.o. daily.  5. Coumadin 5 mg alternating 10 mg daily.   The patient's new medication will be Benadryl over-the-counter 12/25 mg  p.o. q.8 h. p.r.n. for itching.   HOSPITAL COURSE:  The patient is a 45 year old white female with past  medical history of peripheral eosinophilia who was diagnosed  approximately a month ago with a DVT.  She has had a previous history of  a PE back in 2006.  There are no records.  I believe that during her  last diagnosis she was evaluated in the emergency room and sent home.  There was no record that a CT angio of the chest done.  The patient was  not having any chest pain or shortness of breath complaints.  She was  discharged home on Lovenox and Coumadin a month ago and has been on  Coumadin 5 mg alternating with 10 mg daily.  She presented to the  emergency room on March 27, 2009 complaining of some back pain and  lightheadedness which she said was similar to her PE which she had in  2006.  She had a CT angio of the chest done on day of admission shown to  have a pulmonary embolism, however, because  it looked to be adherent to  the sidewall and characteristics looked to be more chronic in nature.  The patient's INR at that time was noted to be therapeutic at 2.1.  There was a question of whether the patient was having an acute PE and  it was felt best that she come in for further evaluation and treatment.  The patient was then brought in in regards to her PE and DVT.  She  underwent bilateral lower extremity venous Doppler.  The preliminary  report noted a nonocclusive DVT throughout the femoral and popliteal  veins although the veins are thrombus free.  The preliminary report  which was written and not dictated did not comment on which leg was  involved, however in comparison to the dictated transcribed report from  1 month ago this looked to be similar in nature.  A 2-D echo was ordered  and was complete on day of  admission results of which noted a noting  wall thickness at upper limits of normal systolic ejection fraction and  lower limits of normal 50-55%.  Left ventricular diastolic levels were  normal.  Atrium normal.  No regurgitation.  No stenosis and otherwise  essentially unremarkable echocardiogram.  The patient's INR remained  therapeutic and initially she was kept on Lovenox.  With these findings,  there was a question of whether or not the patient had a new DVT, PE  versus which would then account that she had failed outpatient Coumadin  therapy requiring a possible filter versus an old finding given the fact  that both her CT angio of her chest notes an old looking pulmonary  embolus.  Her Doppler report is identical.  I do not feel that she has  actually failed treatment of any kind of way and that this is more of  just simple atypical symptoms and a chronic PE and DVT and the PE not  being diagnosed at the time 1 month ago.  There was nothing to indicate  that she is not hypoxic.  She is not short of breath.  She is able to  ambulate well and comfortably on room air.   In regards to her peripheral  eosinophilia the patient had during her hospitalization some episodes of  pruritus.  She had been previously on prednisone taper and this was  stopped several weeks ago.  She appeared to be stable.  She is not  receiving any new medications other than Lovenox restarted which she had  had already twice before in her life and it was unclear if she was  having a systemic allergic reaction to a medication or if this was more  secondary to her eosinophilia re-flaring up.  A differential noted a 24%  eosinophils on March 28, 2009.  I discussed this with Dr. Myna Hidalgo, her  hematologist and he will see her in the office later on this week.   OVERALL DISPOSITION:  Improved.   ACTIVITY:  Slow to increase.   DISCHARGE DIET:  Carb-modified low-sodium diet and she is being  discharged to home.  She will follow up with her PCP, Dr. Lupe Carney  in the next 1-2 weeks but again she shows no signs of any acute DVT or  PE.      Hollice Espy, M.D.  Electronically Signed     SKK/MEDQ  D:  03/29/2009  T:  03/30/2009  Job:  [id number]   cc:   Rose Phi. Myna Hidalgo, M.D.

## 2011-05-02 NOTE — Discharge Summary (Signed)
Margaret Gross, Margaret Gross                ACCOUNT NO.:  [id number]   MEDICAL RECORD NO.:  [id number]          PATIENT TYPE:  INP   LOCATION:  5034                         FACILITY:  [facility]   PHYSICIAN:  Ramiro Harvest, MD    DATE OF BIRTH:  03-08-1966   DATE OF ADMISSION:  12/25/2008  DATE OF DISCHARGE:  01/02/2009                               DISCHARGE SUMMARY   ADDENDUM  This is an addendum to prior a discharge summary, job number [id number], per  Dr. Rito Ehrlich.  This covers the period between December 30, 2008 through  January 02, 2009.   ATTENDING PHYSICIAN:  Dr. Ramiro Harvest.   PRIMARY CARE PHYSICIAN:  Milus Height, MD.   DISCHARGE DIAGNOSES:  1. Bilateral bacterial pneumonia.  2. Urinary tract infection.  3. Diabetes mellitus.  4. Hypertension.  5. Obesity.  6. Previous history of pulmonary embolism.  7. Iron-deficiency anemia/anemia of chronic disease.   DISCHARGE MEDICATIONS:  1. IV Primaxin 500 mg p.o. q.6 h. x7 days.  2. Ciprofloxacin 500 mg p.o. b.i.d. x5 days.  3. Metformin 1 gram p.o. b.i.d.  4. Glucotrol XL 5 mg p.o. daily.  5. Medroxyprogesterone acetate 10 mg daily for 5 days of her menstrual      cycles.  6. Flonase 1 puff daily.  7. Tessalon Perles 200 mg p.o. t.i.d. p.r.n. cough.  8. Quinapril/HCTZ 20/25 1 tablet p.o. daily   DISPOSITION AND FOLLOWUP:  The patient will be discharged home with a  home health RN and IV Primaxin for 7 days.  The patient will need to  follow up with her PCP in 1 week.  Will need a CBC checked on followup  to follow up on her leukocytosis and anemia.  The patient will need  further outpatient workup of her anemia.  The patient will need a basic  metabolic profile checked to follow up on her electrolytes and renal  function.   CONSULTATIONS DONE:  None.   PROCEDURES PERFORMED:  1. A chest x-ray was done on December 25, 2008 that showed patchy      airspace disease noted bilaterally, more confluent lingula and left      perihilar  evolving pneumonia suspected.  Followup to resolution is      recommended.  2. Chest x-ray done December 28, 2008 showed persistent bilateral      airspace disease which may represent pneumonia.  3. Chest x-ray done December 30, 2008 showed a PICC line tip in good      position near the region of the cava atrial junction.  4. A PICC line was placed on December 30, 2008.  5. A renal ultrasound was done on December 31, 2008. which was      negative.  6. A chest x-ray was done on January 01, 2009 that showed bilateral      airspace disease, unchanged.  This may be due to pneumonia or      perhaps underlying chronic lung disease and fibrosis.  Comparison      with baseline studies would be useful.   For prior hospitalization, please see a previous discharge  summary done  per Dr. Rito Ehrlich, job number [id number].  An addendum to this covers the  periods of December 30, 2008 to January 02, 2009.   The patient was initially supposed to be discharged on December 30, 2008,  however, the patient started to spike fevers as high as 103.  Blood  cultures were obtained, with results pending at the time of her  discharge.  Urinalysis was also obtained to rule out a UTI.  A renal  ultrasound was also obtained to rule out abscess formation.  Urinalysis  showed a urinary tract infection.  Repeat chest x-rays were also  obtained, with results as stated above.  The patient's antibiotic  coverage was broadened with the addition of IV ciprofloxacin.  The  patient was maintained on IV ciprofloxacin.  The patient started to  defervesce and by the day of discharge the patient had been afebrile for  48 hours.  The patient will be discharged home on oral ciprofloxacin to  complete a 7-day course for a probable urinary tract infection.  During  the hospitalization, also a slight fluctuant increase in the patient's  leukocytosis with her white count going up as high as 15.8.  The  patient's white count started to trend down  and by the day of discharge  the patient's white count was about 13.7.  The patient remained stable  and improved on a continual basis.  The patient started to tolerate good  p.o. intake.  The patient's oxygenation was checked on room air on  ambulation prior to discharge and the patient was saturating greater  than 92% on room air.  The patient will be discharged home in stable and  improved condition with close followup with her PCP in 1 week as stated  above.   LABS:  On the day of discharge, CBC:  White count 13.7, hemoglobin 8.4,  platelets 237, hematocrit 25.5, ANC of 4.7.  Sodium 133, potassium 3.2,  chloride 103, bicarb 25, BUN 2, creatinine 0.56, glucose of 118, calcium  of 6.7.  The patient's potassium was repleted prior to discharge and  this will need to be followed up as outpatient.   Hypokalemia.  During the hospitalization the patient became hypokalemic.  The patient's potassium was repleted and this will need to be followed  up as an outpatient.   Anemia.  The patient was noted to be anemic during the hospitalization  with a hemoglobin ranging from 8 to 9.  An anemia panel was obtained,  which showed an iron level of 35, a TIBC of 169, B12 levels of 702, a  ferritin level of 203, and the patient's stools were guaiac tested,  which came back positive.  However, the patient did not have any gross  GI bleed.  The patient remained stable and asymptomatic.  This will need  to be followed up as an outpatient.  The patient is a young menstruating  female and her iron-deficiency anemia may be likely related to a  menstruating female, however, the patient will need followup on this.  The patient may need to be referred to a gastroenterologist for further  followup on her anemia workup as an outpatient.   It was a pleasure taking care of [name].     Ramiro Harvest, MD  Electronically Signed    DT/MEDQ  D:  01/02/2009  T:  01/02/2009  Job:  [id number]   cc:    Milus Height, MD

## 2011-05-02 NOTE — H&P (Signed)
Margaret Gross, PARDOE                ACCOUNT NO.:  [id number]   MEDICAL RECORD NO.:  [id number]          PATIENT TYPE:  INP   LOCATION:  1829                         FACILITY:  [facility]   PHYSICIAN:  Hollice Espy, M.D.DATE OF BIRTH:  [birth year]   DATE OF ADMISSION:  12/25/2008  DATE OF DISCHARGE:                              HISTORY & PHYSICAL   PRIMARY CARE PHYSICIAN:  Ma Hillock, NP.   CHIEF COMPLAINT:  Shortness of breath and confusion.   HISTORY OF PRESENT ILLNESS:  The patient is a 45 year old white female  with past medical history of diabetes mellitus and hypertension, who has  been for past week complaining of shortness of breath and nonproductive  cough.  She saw her PCP and it was suspected that she had bronchitis and  was started on a Z-Pak.  Despite completing the Z-Pak, she has continued  to become more short of breath and complaining of dizziness.  Her  husband had noted her to be bit confused, and the patient was brought in  to see her PCP today.  At that time, she was noted a temperature of  99.2, heart rate of 119 and an O2 sat of only 85% on room air.  The  patient received a nebulizer treatment in the office and her O2 sat  improved to 92%.  Given the fact that she had failed outpatient  treatment, there was a concern about the patient developing a worsening  condition.  It was suspected that she had an onset pneumonia underlying.  [facility] hospitalist were called for direct admission.  Because of the  current bed situation, the patient was directed to the emergency room.  There, she was met with some initial orders for a direct admission.  When I saw the patient, she was feeling a little bit better.  She said  that her breathing had eased up somewhat since she received the  breathing treatment in the office.  Her husband states that she looks to  be acting more herself now that she is on oxygen.  The patient herself  is doing okay.  She denies any headaches,  vision changes or dysphagia.  The patient says that she has no shortness breath while wearing oxygen,  but does have significant dyspnea on exertion.  She denies any chest  pain.  No palpitations.  No wheeze.  She has a nonproductive cough.  No  abdominal pain.  No hematuria, dysuria, constipation or diarrhea, though  overall she feels quite fatigued.   REVIEW OF SYSTEMS:  Otherwise negative.   PAST MEDICAL HISTORY:  1. Diabetes mellitus, diagnosed several years ago responding from      gestational diabetes back in 2003.  2. History of hypertension.  3. Obesity.  4. Previous history of PE in 2006.   MEDICATIONS:  1. Metformin 1000 mg p.o. b.i.d.  2. Quinapril/hydrochlorothiazide 20/25 p.o. daily.  3. Medroxyprogesterone acetate 10 p.o. daily for 5 days of her mental      cycle.  4. Flonase 1 puff daily.  5. Glucotrol XL daily.  6. Zithromax which she has completed her  course of.  7. Tessalon Perles 200 p.o. t.i.d.  8. P.r.n. cough syrup with codeine.   ALLERGIES:  NO KNOWN DRUG ALLERGIES.   SOCIAL HISTORY:  No tobacco, heavy alcohol or drug use.   FAMILY HISTORY:  Noncontributory.   PHYSICAL EXAMINATION:  VITAL SIGNS:  The patient's vitals in the  emergency room are as follows; her heart rate is 119, blood pressure  100/78, respirations 20, temperature 98.3.  O2 sat 97% on room air.  GENERAL:  The patient is currently alert and oriented x3 in no apparent  distress.  HEENT:  Normocephalic, atraumatic.  Her mucous membranes are slightly  dry.  NECK:  She has no carotid bruits.  HEART:  Regular rate and rhythm, minimal tachycardia, borderline right  now.  LUNGS:  Decreased breath sounds throughout secondary to her body  habitus, decreased more so at the bases.  ABDOMEN:  Soft, obese, nontender.  Positive bowel sounds.  EXTREMITIES:  Show no clubbing or cyanosis.  Trace pitting edema  bilaterally.   LABORATORY DATA:  I have ordered a CMET and a CBC with diff, both of   which are pending as is a chest x-ray PA and lateral.   PLAN:  1. Pneumonia.  She has failed outpatient antibiotic treatment.  She      shows some signs of hypoxia.  Will plan to treat with IV      antibiotics, nebulizers and oxygen and follow up on her chest x-      ray.  2. Diabetes mellitus.  Sliding scale insulin plus home meds for      diabetes.  Check an A1c.  3. Obesity.  4. Hypertension.  We are holding her p.o. meds given her softer blood      pressure.  Will treat with IV fluid.  5. Reports altered mental status.  Currently she is alert and      oriented.  This is likely secondary to hypoxia.  Will keep her on      O2.      Hollice Espy, M.D.  Electronically Signed     SKK/MEDQ  D:  12/25/2008  T:  12/25/2008  Job:  [id number]   cc:   L. Lupe Carney, M.D.  Ma Hillock NP

## 2011-05-02 NOTE — H&P (Signed)
Margaret Gross, Margaret Gross                ACCOUNT NO.:  [id number]   MEDICAL RECORD NO.:  [id number]          PATIENT TYPE:  INP   LOCATION:  1824                         FACILITY:  [facility]   PHYSICIAN:  Donalynn Furlong, MD      DATE OF BIRTH:  08-17-1966   DATE OF ADMISSION:  03/26/2009  DATE OF DISCHARGE:                              HISTORY & PHYSICAL   PRIMARY CARE PHYSICIAN:  Dr. Lupe Carney with [facility].   CHIEF COMPLAINT:  Back pain, nausea, light-weight feeling.   HISTORY OF PRESENT ILLNESS:  [name] is a 45 year old Caucasian  female.  She lives in [place] with her husband and family.  She  presented to [facility] ER tonight after having one-day symptoms of back  pain.  Her back pain is located over the right side to the spine in the  midback.  Back pain is intermittent and sharp in nature.  It feels like  sharp, stabbing pain when it comes on.  She denies any radiation of the  pain.  She denies any accompanied symptoms with the back pain.  About  five weeks ago, just underneath the area of back pain, she had a  staphylococci infection which was treated with doxycycline and incision  and drainage of abscess.  She mentions that her back pain is a little  above the area of her previous abscess.  The patient denied any fevers,  chills, night sweats, URI symptoms, sinus symptoms, runny nose, cough,  sputum production, chest pain, shortness of breath, tachycardia,  vomiting, diarrhea, abdominal pain, constipation, urinary complaints at  this time.  The patient also mentions that she also started having some  nausea today evening prior to coming to the hospital.  She also  complains of light-weight feeling in her entire body.  She mentions that  it does not feel like lightheadedness, or she did not pass out or have  loss of consciousness.  She just feels that she is light weight in her  entire body.  Her symptoms are very similar to the PE episode she had in  2006.   This is why she was concerned and decided to come to the hospital  for a check up of PE.  In the ER, she was found to have a pulmonary  embolism on CT angiography which is suggestive of ?chronic in nature.  She also mentions that she had DVT found in right lower extremity in  March 2010.  The patient already received a dose of Lovenox 1 mg/kg  subcutaneously in the ER.  The patient was found to be tachycardic on ER  evaluation.  The patient otherwise was stable including temperature and  blood pressure.  The patient mentions that she was able to ambulate  without any trouble while in the ER, and she did not get short of  breath.  Her oxygen saturation has been normal also.   PAST MEDICAL HISTORY:  1. Diabetes mellitus diagnosed in January 2010.  2. Hypertension.  3. History of PE diagnosed in 2006.  4. History of right lower extremity DVT diagnosed  in March 2010.  5. History of pneumonia diagnosed in January 2010.  6. History of obesity.  7. Hypokalemia.  8. She had an episode of anemia which may be due to GI blood loss      according to workup in January 2010 admission note.   The patient was recently admitted multiple times in the hospital.  Please review recent admission and discharge notes for further details.   SOCIAL HISTORY:  The patient denied any tobacco use or illicit use.  She  is an occasional alcohol drinker.  She lives with her husband and family  in [place].   FAMILY HISTORY:  Nothing contributory to the current illness.   REVIEW OF SYSTEMS:  Positive as per HPI.  Otherwise negative review of  systems done for 14 systems.   ALLERGIES:  None.   HOME MEDICATIONS:  Includes:  1. Metformin 1000 mg twice a day.  2. Quinapril/hydrochlorothiazide 20/25 mg once a day.  3. Flonase nasal inhaler 1 puff as needed.  4. Glucotrol 5 mg once a day.  5. Coumadin 5 and 10 mg every other day alternating.  The patient      mentions that she had stopped her Coumadin on April 8  because she      was supposed to have colonoscopy on Monday.  She has taken a dose      of Arixtra yesterday morning.   PHYSICAL EXAMINATION:  VITAL SIGNS:  Blood pressure 129/87, pulse 116,  respiratory rate 18, temperature 97.0, oxygen saturation 98% on room  air.  GENERAL EXAM:  Alert and oriented x3, lying in bed without any acute  distress.  CARDIOVASCULAR:  S1-S2, irregular, tachycardia.  No murmur or gallop  appreciated.  LUNGS:  Clear to auscultation bilaterally.  No wheezing, rhonchi,  crackles.  Patient's breath sounds are obliterated due to her body  habitus.  ABDOMEN:  Nontender, nondistended.  Bowel sounds present.  Scar from  previous surgery noted.  No deep organs palpated.  Patient is obese.  EXTREMITIES:  Mild swelling at the right ankle.  Otherwise, pulses  positive in all 4 extremities.  No clubbing or cyanosis.  SKIN:  No rashes or bruises.  NEUROLOGIC EXAM:  Shows intact cranial nerves, muscular strength,  sensation and reflexes.  HEENT:  Normocephalic, atraumatic.  Eyes:  Pupils are equal, round, and  reactive to light and accommodation.  Extraocular muscles intact. Oral  cavity:  Moist oral mucosa.  No thrush noted.  NECK:  No thyromegaly or JVD.   LABORATORY DATA:  Shows WBC 8.5, hemoglobin 13.6, platelets 235,  absolute eosinophil counts are 2200.  INR 2.1, PT 25, PTT 43.  Basic  metabolic panel normal except sodium 134, potassium 3.1, and blood  glucose 122.  First set of cardiac enzymes is negative.  CT angiogram  done today shows thrombus within the left pulmonary arteries, appears  attached to wall which is probably suggestive of chronic in nature and  subpleural pulmonary nodularity with questionable inflammatory etiology.   ASSESSMENT AND PLAN:  1. 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  2. Hypokalemia.  3. Elevated INR while on Coumadin.  4. Uncontrolled diabetes mellitus.   5. Hypertension.  6. History of right lower extremity deep vein thrombosis.  7. History of pulmonary embolism in 2006.  8. History of pneumonia in the past.  9. History of obesity.   PLAN:  Will admit the patient  on step down unit due to her acute  diagnosis of PE.  The patient will be full code.  The patient will be  n.p.o. except medications, ice chips and water.  We will check vitals,  input and output per protocol.  We will get CBC with differential, PTT,  PT, INR, CMP in the morning.  We will provide oxygen by nasal cannula to  keep saturations above 90.  Will start sliding scale insulin a.c. and  h.s. with scale 1.  Will provide Lovenox 1 mg/kg subcutaneously q.12 h.  Will start IV normal saline plus 40 mEq of potassium chloride at 100 mL  per hour for 2 liters.  We will continue Glucotrol 5 mg p.o. daily.  Will hold her metformin because she received contrast for PE.  We will  get echocardiogram in the morning to be read by Dr. Armanda Magic or her  group for the diagnosis of PE.  Further workup according to the plan  pending.      Donalynn Furlong, MD  Electronically Signed     TVP/MEDQ  D:  03/27/2009  T:  03/27/2009  Job:  [id number]   cc:   L. Lupe Carney, M.D.  Rose Phi. Myna Hidalgo, M.D.

## 2011-05-02 NOTE — Consult Note (Signed)
NAMEANJENETTE, GERBINO                ACCOUNT NO.:  [id number]   MEDICAL RECORD NO.:  [id number]          PATIENT TYPE:  INP   LOCATION:  6735                         FACILITY:  [facility]   PHYSICIAN:  Rose Phi. Myna Hidalgo, M.D. DATE OF BIRTH:  July 12, 1966   DATE OF CONSULTATION:  01/11/2009  DATE OF DISCHARGE:  01/18/2009                                 CONSULTATION   REFERRING PHYSICIAN:  Dr. Adela Glimpse   REASON FOR CONSULTATION:  1. Leukocytosis with marked eosinophilia.  2. Recurrent fevers.  3. Rash.   HISTORY OF PRESENT ILLNESS:  Ms. Patchen is very nice 45 year old white  female with a history of hypertension.  She has a remote history of  pulmonary emboli back in 2006.  This was thought secondary to oral  contraceptives.   She previously discharged on December 02, 2008, after having episode of  pneumonia.  She was subsequently discharged on antibiotics with  Primaxin and Cipro.  Primaxin was given IV.   When she was in the hospital with her last admission, she was found that  she was noted to have leukocytosis with no eosinophilia.  Her white cell  count was 13.7.  She also was quite anemic with hemoglobin of 8.4.  Platelet count was okay.   When she was admitted on December 07, 2008; white count 16, hemoglobin  8.6, hematocrit 26, and platelet count was 249.  She had 41%  eosinophils.   On December 08, 2008, her white cell count was 15 and hemoglobin dropped  and was down to 6.3.   She had an anemia panel done.  She had decreased reticulocytes.  B12 was  708.  Her iron studies were fairly unrevealing.  Her serum folate was  2.8.  She had metabolic panel, which showed sodium 134, potassium 3.6,  BUN 7, and creatinine 0.58.  Her LFTs were somewhat elevated.  Total  bilirubin is 2.5.  IV was only 2.0 with a total protein of 4.8.  She had  a TSH done, which was 2.8.   She had a sed rate, which was markedly elevated at 93.  HIV was  nonreactive.  She had LDH which was elevated at  418.  A direct Coombs  test was positive for IgG and a complement.   She was transfused.   She had a C. diff, because of I think diarrhea, which was negative.  Her  stool was positive for occult blood on occasion.  She did have positive  fecal lactoferrin.  A haptoglobin was done for concern of hemolysis and  this was 192.  She had a urine culture, which was negative.  Sputum  culture was nonproductive.   ID has seen her.  She had multiple GI studies done.  Giardia and  Cryptosporidium were both done, and these were both negative.  She had  Mycoplasma titer, which was normal at 526.  Legionella antigen urine was  negative.  A stool culture was done, which was also negative.  She did  have a cold agglutinin titer less than 1 and at 32.   When we saw her, I  reviewed a blood smear.  It was noted to be marked  with increasing eosinophilias.   However, on the 25th, her white cell count was up to 24.5.  Hemoglobin  7.7 and hematocrit 23.2.  She had 60% eosinophils.   We did send off a IgE level, which was markedly elevated at 687.  A  tryptase level has been ordered and not back yet.  The patient had  multiple blood cultures done.   During hospitalization, she did have a chest x-ray.  This was done on  admission.  This showed a bilateral pulmonary air space disease with  mild improvement since January 01, 2009.  She had a CT angiogram of  the chest.  This showed patchy bilateral infiltrates.  These were  consistent with pneumonia.  She has got scattered nodule foci of both  lungs.  There is no evidence of pulmonary emboli.  She had mildly  reactive of mediastinal nodes.  Splenomegaly was also noted.   She did undergo CT of the abdomen and pelvis.  This was done on January  26.  Spleen was enlarged at 16 x 15 x 11.  She had multiple splenic  infarcts.  Her liver was also noted to be enlarged.  Liver was  homogeneous in size.  There was a diffuse inflammatory process involving  the  colon.  She had borderline abdominal adenopathy.   She did undergo a bone marrow test.  This was on December 14, 2008.  As  of this dictation, results are pending.   She managed to have a continued abdomen temperature spikes.  She went to  101.7.   She finally was put on steroids.  She was placed on prednisone at 40 mg  a day.  This was done on December 15, 2008.   She denied some foreign travel back in the summer.  Destructively, she  did get sick early had gone to the emergency room and subsequently, she  got IV fluids.   On January 18, 2009, her labs shows CBC of white cell count 13.7,  hemoglobin 8.2, hematocrit 24.7, and platelet count 210.  Her percent  eosinophils were down to 44%.  Retic count was 6.2%.  When corrected for  her hemoglobin, the retic count was about 3%.   During her hospital stay, she has been transfused with 4-5 units of  packed red blood cells.   GI has seen her.  This is for occult blood noted in the stool.  I  suspect a colonoscopy will be done as an outpatient.   She did have a flare-up of her rash.  This happened on December 28,  45.   This does seem to improve with respect to starting her steroids.   PAST MEDICAL HISTORY:  Remarkable for:  1. Hypertension.  2. Diabetes.  3. History of pulmonary emboli.   ALLERGIES:  None.   MEDICATIONS ON ADMISSION:  1. Metformin 1 g p.o. b.i.d.  2. Primaxin 500 mg at q.6 h.  3. Cipro 500 mg p.o. b.i.d.  4. Glucotrol XL 5 mg p.o. daily.  5. Medroxyprogesterone 10 mg p.o. daily.  6. Flonase nasal spray 1 puff daily.  7. Quinapril and hydrochlorothiazide (20/25) 1 p.o. daily.   SOCIAL HISTORY:  Negative for tobacco use.  There is no history of  alcohol use that is significant.  She has no history of recreational  drug use.  She has no obvious occupational exposures.   FAMILY HISTORY:  Noncontributory.   PHYSICAL EXAMINATION:  GENERAL:  This is a mildly-obese white female, in  no obvious distress.   VITAL SIGNS:  Temperature 98.2, pulse is 100, respiratory rate 16, blood  pressure 120/76.  HEENT:  Normocephalic and atraumatic skull.  There is no scleral  icterus.  She had no adenopathy in her neck.  Thyroid was not palpable.  She had no oral lesions.  There is no mucositis.  LUNGS:  Clear bilaterally.  CARDIAC:  Regular rate and rhythm with normal S1 and S2.  There were no  murmurs, rubs, or bruits.  ABDOMINAL:  Soft with good bowel sounds.  There is no palpable abdominal  mass.  There is no fluid wave.  There is no palpable hepatosplenomegaly.  BACK:  No tenderness __________spine.  EXTREMITIES:  No clubbing, cyanosis, or edema.  SKIN:  Does show a faint macular type rash.  This does not blanch with  pressure.  It is somewhat pruritic.  It is mostly located on her arms  and legs.  NEUROLOGIC:  No obvious neurological deficits.   ADDITIONAL LABORATORY STUDIES:  She had an echocardiogram done on  January 08, 2009.  This showed a left ventricular ejection fraction of  65%.  There is no wall motion of allergies.  There is no valvular  abnormalities.  She had no pericardial effusion.   IMPRESSION:  Ms. Kindel is a 45 year old female with anemia,  leukocytosis, and a prominent eosinophilia.  1. Certainly has to wonder about an underlying marrow disorder.  Going      to back to her previous labs, she had eosinophilia.  This is marked      eosinophilia.  One will have to wonder if she does not have a bone      marrow disorder, such as hypereosinophilic syndrome.  Chronic      eosinophilic leukemia also would be a differential.  Hopefully, the      bone marrow biopsy that she had will help [place] out.  We will send off      cytogenetic studies looking for abnormalities with her chromosomes.   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  The fact that the rash improve with steroids certainly would be  indicative of a primary marrow disorder.   I certainly do not  see any obvious infection that is causing all this.  I do not see any obvious allergic reaction.  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   I do not see any evidence of cardiac or neurologic abnormalities.  This  can sometimes be seen with primary hypereosinophilic syndrome.   I think the bone marrow test will be key in deciding what is going on  with Ms. Sires.   I will certainly keep her on prednisone.  I felt she had  hepatosplenomegaly with splenic infarcts that certainly that goes long  with a primary marrow disorder.  The anemia could be from splenomegaly.  It is unclear as to whether or not this is hemolytic or not.  She does  have a positive direct Coon's, but her retic count when corrected  certainly is not all that impressive.  She never had a decreased  haptoglobin.   We will certainly follow up Ms. Espejo while she is in the hospital.  We  will certainly followup her as an outpatient.  She does need to have a  colonoscopy done.  Even I am not sure as to what is going on with the  colon on the  CT scan.  Again, the diarrhea certainly could be all part  of the hypereosinophilic syndrome.   This is most fascinating case.  We will certainly follow along and help  as much as possible.       Rose Phi. Myna Hidalgo, M.D.  Electronically Signed     PRE/MEDQ  D:  01/19/2009  T:  01/19/2009  Job:  [id number]   cc:   L. Lupe Carney, M.D.

## 2011-05-02 NOTE — Discharge Summary (Signed)
Margaret Gross, Margaret Gross                ACCOUNT NO.:  [id number]   MEDICAL RECORD NO.:  [id number]          PATIENT TYPE:  INP   LOCATION:  5034                         FACILITY:  [facility]   PHYSICIAN:  Hollice Espy, M.D.DATE OF BIRTH:  1966-08-15   DATE OF ADMISSION:  12/25/2008  DATE OF DISCHARGE:                               DISCHARGE SUMMARY   PRIMARY CARE PHYSICIAN:  Dr. Altamease Oiler Redmon of [place].   DISCHARGE DIAGNOSES:  1. Bacterial pneumonia.  2. Diabetes mellitus.  3. Hypertension.  4. Obesity.  5. Previous history of pulmonary embolus.   Discharge medications for this patient are as follows;  1. Metformin 1000 mg p.o. b.i.d.  2. Medroxyprogesterone acetate 10 p.o. daily for 5 days of her      menstrual cycle.  3. Flonase 1 puff p.o. daily.  4. Glucotrol XL p.o. daily.  5. Tessalon Perles 200 p.o. t.i.d. p.r.n. cough syrup with codeine.  6. Quinapril/hydrochlorothiazide 20/25 p.o. daily.   The patient is also being discharged on IV antibiotics, IV Primaxin 500  mg p.o. q.6 h. x7 days and home oxygen O2 2 liters to be discontinued  following followup once her oxygenation has improved.   HOSPITAL COURSE:  The patient is a 45 year old white female with past  medical history of diabetes mellitus, hypertension, obesity who  presented with shortness of breath and nonproductive cough.  She saw her  PCP who had started her on a Z-Pak.  She had completed the Z-Pak and  became more short of breath, running of dizziness.  Her husband was  concern about her having some problems with confusion secondary to  hypoxia.  When we checked her O2 sat, she was noted to be in the mid  80s.  The patient was arranges a direct admission to the hospital.  Her  labs were ordered and the patient was resumed on oxygen and IV Avelox.  Her initial white count was 14.  By hospital day 2, her white count had  not changed much, but she had been afebrile and starting to breathe more  comfortably on  oxygen.  By hospital day 3, her white count improved down  to 11.4.  She was feeling much improved, though she was still on 92% O2  sats on 2 liters of oxygen and the plan was to ambulating her off O2 and  checking her saturations.  Her other medical issues including diabetes  and hypertension remained stable.  She had some episodes of hypokalemia  which was replaced with p.o. potassium.  On December 28, 2008, her white  count increased from 11.4 up to 13.5.  She had not received any steroids  and during the day of December 28, 2008, she started having a few febrile  spikes of temps around 101.5.  Now, urinalysis was checked, which was  found to be negative.  A chest x-ray was done with the final report  reading, no change in her pneumonia.  By December 29, 2008, her initial  white blood cell count had decreased from 13.5 down to 12.3 on its own.  Given the fact that  she is still having some febrile spikes, I felt that  she likely more needed stronger antibiotics and the plan is to change  the patient from IV Avelox to IV Primaxin 500 q.6.  The patient was  ambulated on room air and she started initially sating from 91-92%, but  then after extensive walk down the hallways, her sats dropped down to  88%.  I discussed this with the patient and now that she is going to  improve, the plan will be to keep the patient one more day in the  hospital until December 30, 2008.  We will set up a PICC line, IV  Primaxin starting here in the hospital with a followup CBC on December 30, 2008 a.m., then will discharge the patient to home with home health  IV antibiotics for the next 7 days, continuous home oxygen 2 liters  until she follows up with her PCP, Dr. Altamease Oiler Redmon in 1 week's time.  Her overall disposition from initial presentation is improved.  Her  activity will be slow to increase, continued oxygen.  Her discharge diet  will be a carb modified low-sodium diet and she is being discharged to   home.      Hollice Espy, M.D.  Electronically Signed     SKK/MEDQ  D:  12/29/2008  T:  12/30/2008  Job:  [id number]   cc:   Dr. Altamease Oiler Redmon

## 2011-05-02 NOTE — Op Note (Signed)
Margaret Gross, Margaret Gross                ACCOUNT NO.:  [id number]   MEDICAL RECORD NO.:  [id number]          PATIENT TYPE:  AMB   LOCATION:  OMED                         FACILITY:  [facility]   PHYSICIAN:  Rose Phi. Myna Hidalgo, M.D. DATE OF BIRTH:  12/09/1966   DATE OF PROCEDURE:  05/26/2009  DATE OF DISCHARGE:  05/26/2009                               OPERATIVE REPORT   PROCEDURES:  Bone marrow biopsy and aspirate.   The patient was brought to the short stay unit.  She had a Mallampati  score of 1.  Her ASA classification is 1.   IV was placed without any difficulty.  The patient was placed onto her  right side.  She received a total of 5 mg of Versed and 50 mg Demerol  for IV sedation.   We then prepped and draped the left posterior iliac crest region in a  sterile fashion and 10 mL of 2% lidocaine was infiltrated in the skin  down to the periosteum.  I used a spinal needle to reach the periosteum.   I had to use a large Jamshidi biopsy needle to reach the periosteum in  order to obtain a biopsy and aspirate.   This was done without difficulties.  We got two aspirates.   The biopsy was excellent in quality and quantity.   [name] tolerated procedure well.  There no complications.      Rose Phi. Myna Hidalgo, M.D.  Electronically Signed     PRE/MEDQ  D:  07/02/2009  T:  07/03/2009  Job:  [id number]

## 2011-05-02 NOTE — H&P (Signed)
NAMEANNELISE, MCCOY                ACCOUNT NO.:  [id number]   MEDICAL RECORD NO.:  [id number]          PATIENT TYPE:  INP   LOCATION:  6735                         FACILITY:  [facility]   PHYSICIAN:  Michiel Cowboy, MDDATE OF BIRTH:  12-28-1965   DATE OF ADMISSION:  01/07/2009  DATE OF DISCHARGE:                              HISTORY & PHYSICAL   PRIMARY CARE PROVIDERS:  Elsworth Soho, M.D. and Lurena Joiner A. Arletha Grippe,  M.D., but the primary care physician is L. Lupe Carney, M.D.   CHIEF COMPLAINT:  Fever.   HISTORY OF THE PRESENT ILLNESS:  The patient is a 45 year old female  with a past medical history of diabetes, hypertension and remote  pulmonary embolus in 2006  thought to be secondary to birth control  pills.  The patient had a recent admission for bilateral pneumonia and  then she was started on Avelox, but was noted to have an elevated white  blood cell count despite antibiotic treatment and was also very slow to  recover.  She was switched to Primaxin and was sent home with PICC line.  Of note, her white blood cell count continued to trend and was around 13  at the time of discharge.  The patient was also started on Cipro for a  urinary tract infection.  She was discharged to home on January 02, 2009.  The patient apparently did well at home, but today started to  feel generalized overall not well.  She continued to have some mild  cough throughout and her cough had gotten somewhat worse.  She spiked a  fever up to 102, which brought her back into emergency department.   REVIEW OF SYSTEMS:  The patient denies, otherwise, any chest pain.  She  has some shortness of breath, which is slightly worse than her baseline  and some coughing.  She has no abdominal pain, no nausea and no  vomiting.  She has been having diarrhea since her discharge attributed  to her antibiotics.  Otherwise the review of systems is negative.  No  sick contacts.   SOCIAL HISTORY:  The patient has  never smoked.  She does use drugs.  She  drinks alcohol occasionally.  She has a 87-year-old son and a very  supportive husband.   FAMILY HISTORY:  The family history is noncontributory.   ALLERGIES:  No known drug allergies.   MEDICATIONS:  1. Metformin 1 gram by mouth twice a day.  2. IV Primaxin 500 mg every 6 hours x7 days.  3. Ciprofloxacin 100 mg by mouth twice a day.  The patient finished      her course 2 days ago.  4. Glucotrol XL 5 mg by mouth daily.  5. Medroxyprogesterone 10 mg by mouth daily.  6. Flonase 1 puff daily.  7. Tessalon Perles 200 mg three times a day as needed cough.  8. Quinapril/hydrochlorothiazide 20/25 one tab by mouth every day.   PHYSICAL EXAMINATION:  VITAL SIGNS:  Temperature 99.0 here, blood  pressure 147/104, pulse was 122 and is now down to the 80s, respirations  22, and satting  96% on an unclear amount of oxygen.  GENERAL APPEARANCE:  The patient appears to be in no acute distress.  HEENT:  Head is atraumatic.  Moist mucous membranes.  LUNGS:  The lungs have occasional crackles, but no wheezes are  appreciated.  Somewhat distant breath sounds.  HEART:  The heart has a regular rate and rhythm with no murmurs  appreciated.  ABDOMEN:  The abdomen is soft, nontender and nondistended, but is  somewhat obese.  EXTREMITIES:  The lower extremities reveal no clubbing, cyanosis or  edema.  There is a rash on her back and the back of her arms, which has  been there for few days now.   LABORATORY DATA:  White blood cell count 16.2, which is up from before,  and hemoglobin 8.6.  Of note, the patient has an absolute count of  eosinophils of 6.6, low absolute lymphocytes down to 0.6 and somewhat  high neutrophils 8.5.  Sodium 133, potassium 4.4 and  creatinine 0.65.  UA is positive for nitrates.  Her chest x-ray done today actually showed  some improvement.   ASSESSMENT AND PLAN:  This is a 45 year old female with recurrent fevers  and possibly an  initial diagnosis of pneumonia.  Per review of her films  the initial film was worrisome for underlying pulmonary disease.  They  could not rule out fibrosis.  The patient continues to have fevers; and,  anyway the white blood cell count has a very large eosinophil count.   1. Persistent pulmonary disease with fever; etiology is not quite      clear.  The patient has been taking ciprofloxacin and imipenem.      She has a peripherally inserted central catheter line in place.  We      will repeat blood cultures, try to obtain sputum cultures and order      a two-dimensional echocardiogram to evaluate for vegetation.  So      far she has never had positive blood cultures.  Given that she has      a peripherally inserted central catheter line we will cover with      vancomycin, but the peripherally inserted central catheter line      does not appear to be infected.  We will change her to Zosyn as      this should cover both urine and lungs as well as anaerobes.  Given      questionable pulmonary decease we will obtain a computerized      tomography of the chest to try to evaluate the parenchyma better.      Also given that the patient had pulmonary emboli in the past we      will make sure that this is with intravenous contrast, although      pulmonary embolus is less likely.  We will obtain ANA, ENA,      sedimentation rate, rheumatoid factor, and we will check a human      immunodeficiency virus serologies, although this is less likely.  I      strongly recommend infectious disease consult and also possibly a      pulmonary consult in the morning if this could be in atypical      presentation of __________  pneumonia or an underlying lung      disease.  2. Anemia.  The patient had Hemoccult positive stools in the past.  We      will check an anemia panel.  She will likely need at  scope at some      point.  This either needs to be done as an outpatient or if the      patient stays here for  too long as an inpatient.  3. Diabetes.  We will give sliding scale.  Hold metformin and      glipizide for now.  4. Rash.  This seems to be maybe similar to a heat rash, but cannot      rule out an allergic reaction, especially given elevated      eosinophils.  I am not sure if this is a reaction to Cipro or      imipenem; but, it should be considered whether or not that we      switch her antibiotics; and, we will see if this improves.  We will      continue to follow for the possibility of fever being the part of a      drug reaction as this should also be considered.  5. Hypertension.  Blood pressure was somewhat low.  We will hold      quinapril.  Also given persistent cough it      would be nice to hold her angiotensin converting enzyme.  6. Prophylaxes:  Protonix plus sequential compressive devices, but      will hold Lovenox until gastrointestinal bleed is ruled out.      Michiel Cowboy, MD  Electronically Signed     AVD/MEDQ  D:  01/07/2009  T:  01/08/2009  Job:  [id number]   cc:   L. Lupe Carney, M.D.

## 2011-05-02 NOTE — Discharge Summary (Signed)
NAMEKENTRELL, GUETTLER                ACCOUNT NO.:  [id number]   MEDICAL RECORD NO.:  [id number]          PATIENT TYPE:  INP   LOCATION:  6735                         FACILITY:  [facility]   PHYSICIAN:  Hollice Espy, M.D.DATE OF BIRTH:  1966/07/19   DATE OF ADMISSION:  01/07/2009  DATE OF DISCHARGE:  01/18/2009                               DISCHARGE SUMMARY   PRIMARY CARE PHYSICIAN:  L. Lupe Carney, M.D.   CONSULTANTS ON THIS CASE:  1. Mick Sell, MD, Infectious Disease.  2. Rose Phi. Myna Hidalgo, MD, Hematology.   DISCHARGE DIAGNOSES:  1. Anemia, hemolytic versus gastrointestinal blood loss.  2. Fever.  3. Macular rash, drug reaction versus autoimmune.  4. Hypotension.  5. Diabetes mellitus.  6. Eosinophilia.  7. Persistent pneumonia, status post completion of antibiotics.   DISCHARGE MEDICATIONS:  The patient will put her  quinapril/hydrochlorothiazide on hold x1 week then resume.  She will  continue her stopped Primaxin.  She will continue her previous other  medicines.  Metformin 500 b.i.d., Glucotrol 5 p.o. daily, Tessalon  Perles 100 p.o. t.i.d., Flonase nasal spray 1 inhaled daily,  progesterone 10 p.o. daily.   NEW MEDICATIONS:  Folate 1 mg p.o. daily, prednisone 40 p.o. daily,  Mucinex 600 p.o. b.i.d. p.r.n., Protonix 40 p.o. daily, nystatin every 8  hours x1 week, Benadryl 25 mg p.o. q.8 h p.r.n. for itching, Atarax 25  mg p.o. q.6 h p.r.n., triamcinolone cream 0.5% topically 2 times a day  as needed for rash.   FOLLOWUP APPOINTMENTS:  The patient will follow up with Dr. Myna Hidalgo, her  hematologist next week.  Dr. Myna Hidalgo will set up this appointment.  The  patient will follow up with Dr. Clydie Braun, Infectious Disease [facility]  [facility] on January 21, 2009 at 9:30 a.m.  The  patient will follow up with [facility] for an outpatient colonoscopy.  The  patient will follow up with her PCP, Dr. Lupe Carney in 3-4 weeks.   DISCHARGE DIET:   Diabetic diet.   ACTIVITY:  Increase activity slowly.   DISPOSITION:  The patient's overall disposition from initial  presentation is improved.   HOSPITAL COURSE:  The patient is a 45 year old white female with a  recent discharge from the [facility] for a bilateral pneumonia,  started on Avelox, switched over to Primaxin, sent home with a PICC  line.  Her white count has continued to trend up, and she was around 13  at the time of discharge.  She was also started on Cipro for urinary  tract infection.  The patient had been discharged on January 02, 2009.  She initially has been well, but then started to feel worse and  presented on January 07, 2009 with a fever up to 102.  The patient was  found, at that point, to have white count of 16.2, but interestingly she  had an absolute count eosinophils at 6.6 markedly elevated.  Her UA was  positive for nitrites.  Chest x-ray actually showed some signs of  improvement.  The patient was admitted for initially what as felt  persistent pulmonary disease with fever.  The patient was also noted to  have some anemia with a hemoglobin of 6.3.  Her white count by hospital  day 2 had also came down slightly under 15.3.  She by hospital day 2,  was complaining of a macular rash on her trunk and arms, and she  continued despite antibiotics to be spiking temperatures as high as  100.2.  Infectious Disease was consulted for recurrent fevers, concerns  about resistant to pneumonia and on further evaluation, they noted that  her HIV tests were negative.  Her sed rate was elevated at 93 and over  the next several days, the patient's white count actually increased up  to 18.9.  Her peripheral eosinophil count was up to 35%.  There was a  concern by Infectious Disease that this was an atypical community-  acquired pneumonia.  Further lab work was done.  The patient's  haptoglobin was 192.  Her mycoplasma serologies were negative.  Legionella was  negative.  Her white count was now up to 24.5, but again  with a strong eosinophil predominant.  Dr. Myna Hidalgo from Hematology was  consulted and felt that this could be a perhaps drug reaction versus  atypical bacterial infection.  He do not think at this time steroids was  initially indicated.  Over the next several days, the patient was  continued to be monitored.  She was not transfused.  Her hemoglobin  improved up to 7.7 as of January 25 and around 8.1 as of January 26.  By  x-ray, her bilateral pneumonia persisted.  She was then put on Zithromax  as well, but then by January 27, the patient was worsening.  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  Peripheral IgE counts looked to be elevated.  Dr.  Myna Hidalgo was concerned about the less likely possibility of lymphoma and  recommended a bone marrow biopsy.  The patient completed bone marrow  biopsy on January 13, 2009.  Over the next several days, she was  watched.  Her hemoglobin dropped down from 8.5 down to 7.7.  At this  point, steroids at 40 mg of prednisone daily was started.  The patient's  hemoglobin stayed stable and over the next several days, she did remain  stable.  She briefly continued to spike some temperatures, but after  holding her prednisone she has had no further temperatures, and her  hemoglobin has been stable in the range of 8-9.  As of January 18, 2009,  the patient was felt to be improved on steroids.  They recommended  watching her especially with her history of diabetes and CBGs.  I  recommended restarting her diabetic medications initially put on hold.  The patient's blood pressures have become soft over the last several  days with systolic blood pressure in the high 90s to 110 and her stool  was noted to be slightly heme positive. Dr. Myna Hidalgo felt for a complete  workup, she would need a colonoscopy; however, this was stable, she  could have this done  as an outpatient.  Infectious Disease followed up  on February 2.  They felt that this overall summed up picture of  eosinophilia, anemia, splenic infarct, mild colonic thickness, but at  least the acute stabilization with steroid seem to be working.   PLAN:  Will be for the patient to follow up with Oncology and Infectious  Disease.  Continue on  a prednisone taper.  Treat her rash  symptomatically with Benadryl and topical creams.  She has completed a  course of antibiotics and at this time will be off antibiotics, and  continue to monitor.   The patient's overall disposition from initial presentation again is  improved, but long term would depend on identification pathology from  bone marrow biopsy, which at this time is pending.      Hollice Espy, M.D.  Electronically Signed    SKK/MEDQ  D:  01/18/2009  T:  01/19/2009  Job:  [id number]   cc:   Rose Phi. Myna Hidalgo, M.D.  Elsworth Soho, M.D.  Mick Sell, MD

## 2011-05-23 ENCOUNTER — Other Ambulatory Visit: Payer: Self-pay | Admitting: Hematology & Oncology

## 2011-05-23 ENCOUNTER — Encounter (HOSPITAL_BASED_OUTPATIENT_CLINIC_OR_DEPARTMENT_OTHER): Payer: BC Managed Care – PPO | Admitting: Hematology & Oncology

## 2011-05-23 DIAGNOSIS — Z7901 Long term (current) use of anticoagulants: Secondary | ICD-10-CM

## 2011-05-23 DIAGNOSIS — D721 Eosinophilia, unspecified: Secondary | ICD-10-CM

## 2011-05-23 DIAGNOSIS — I82409 Acute embolism and thrombosis of unspecified deep veins of unspecified lower extremity: Secondary | ICD-10-CM

## 2011-05-23 LAB — CBC WITH DIFFERENTIAL (CANCER CENTER ONLY)
BASO%: 0.5 % (ref 0.0–2.0)
EOS%: 7.1 % — ABNORMAL HIGH (ref 0.0–7.0)
HCT: 37 % (ref 34.8–46.6)
LYMPH%: 22.7 % (ref 14.0–48.0)
MCHC: 34.3 g/dL (ref 32.0–36.0)
MCV: 75 fL — ABNORMAL LOW (ref 81–101)
NEUT%: 64.5 % (ref 39.6–80.0)
RDW: 14.1 % (ref 11.1–15.7)

## 2011-05-23 LAB — CHCC SATELLITE - SMEAR

## 2011-07-25 ENCOUNTER — Other Ambulatory Visit: Payer: Self-pay | Admitting: Family Medicine

## 2011-07-25 ENCOUNTER — Ambulatory Visit
Admission: RE | Admit: 2011-07-25 | Discharge: 2011-07-25 | Disposition: A | Discharge status: Home / Self Care | Payer: BC Managed Care – PPO | Source: Ambulatory Visit | Attending: Family Medicine | Admitting: Family Medicine

## 2011-07-25 DIAGNOSIS — M79669 Pain in unspecified lower leg: Secondary | ICD-10-CM

## 2011-08-25 IMAGING — MG MM SCREEN MAMMOGRAM BILATERAL
1 series · 1 of 1 positions shown · non-contrast
Comparison: none

DG SCREEN MAMMOGRAM BILATERAL
Bilateral CC and MLO view(s) were taken.

DIGITAL SCREENING MAMMOGRAM WITH CAD:
The breast tissue is almost entirely fatty.  No masses or malignant type calcifications are 
identified.
Images were processed with CAD.

[L CC]
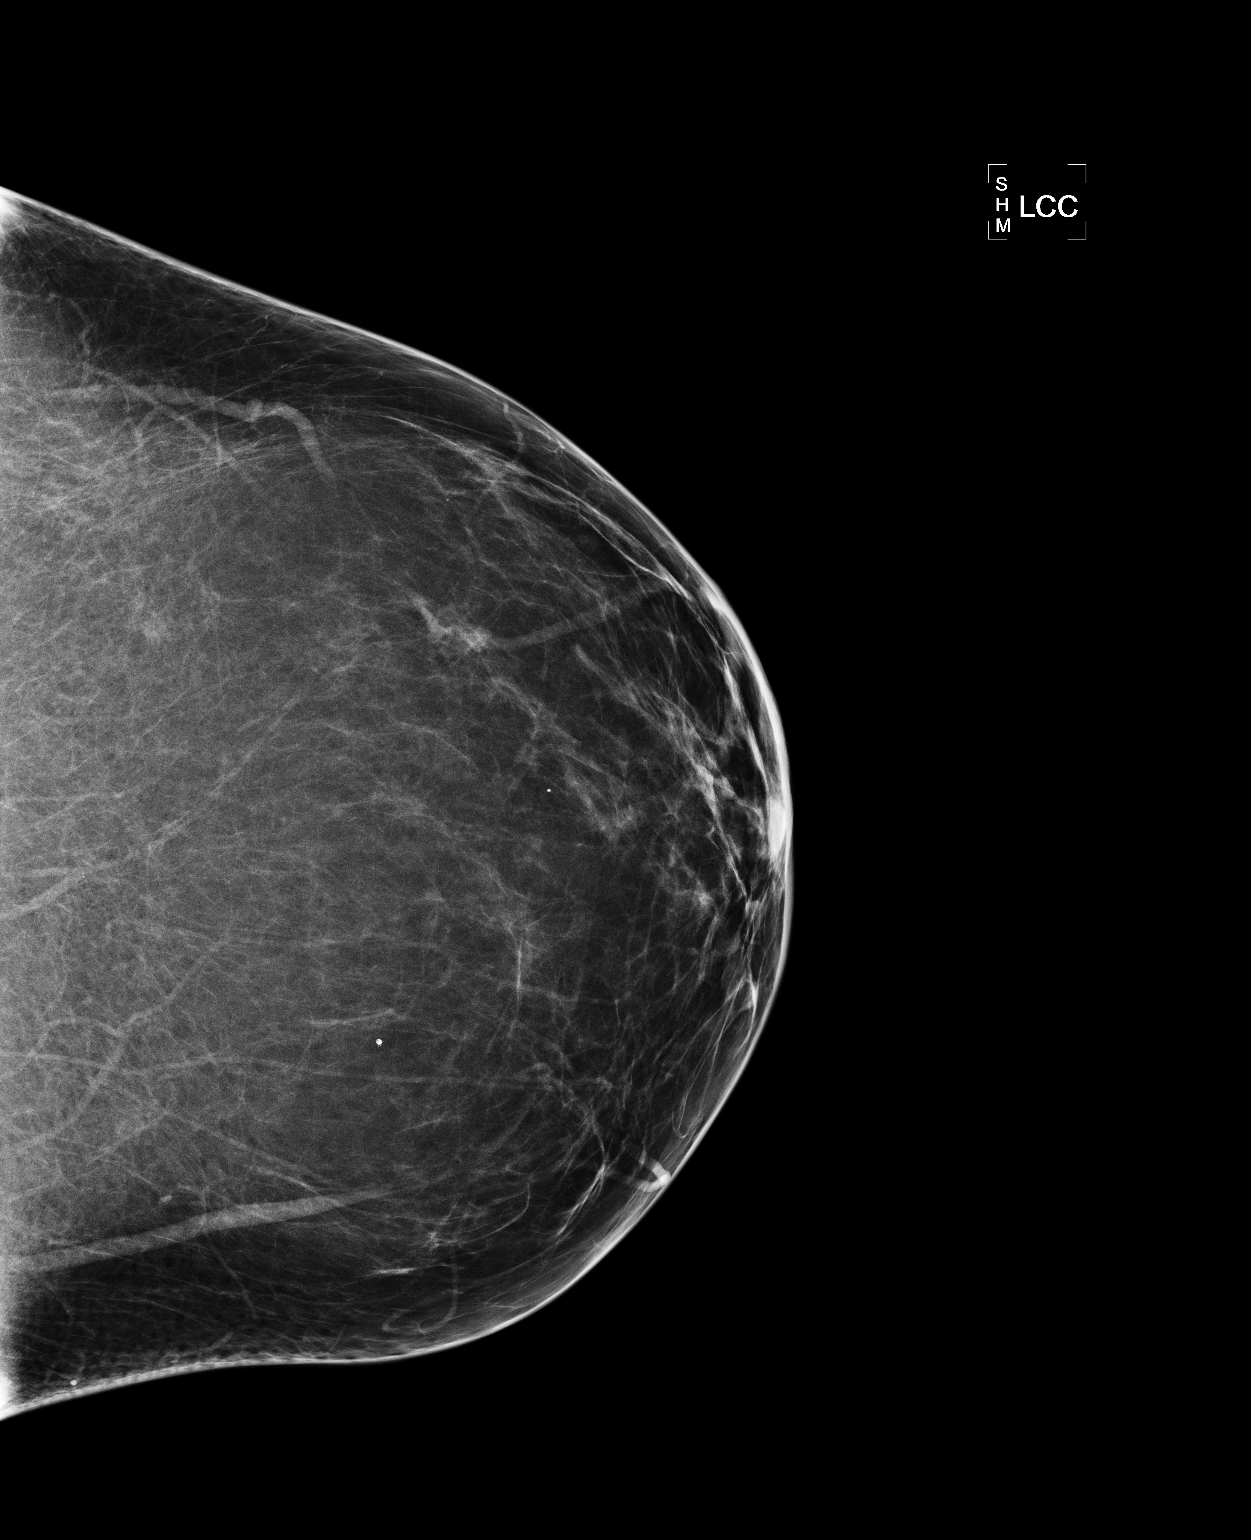

[1 of 1 positions shown; findings below may reference images not displayed]

IMPRESSION: No specific mammographic evidence of malignancy.  Next screening mammogram is recommended in one 
year.

A result letter of this screening mammogram will be mailed directly to the patient.

ASSESSMENT: Negative - BI-RADS 1

Screening mammogram in 1 year.
,

## 2011-09-01 ENCOUNTER — Encounter (HOSPITAL_BASED_OUTPATIENT_CLINIC_OR_DEPARTMENT_OTHER): Payer: BC Managed Care – PPO | Admitting: Hematology & Oncology

## 2011-09-01 ENCOUNTER — Other Ambulatory Visit: Payer: Self-pay | Admitting: Hematology & Oncology

## 2011-09-01 DIAGNOSIS — I82409 Acute embolism and thrombosis of unspecified deep veins of unspecified lower extremity: Secondary | ICD-10-CM

## 2011-09-01 LAB — CBC WITH DIFFERENTIAL (CANCER CENTER ONLY)
BASO%: 0.5 % (ref 0.0–2.0)
EOS%: 5.5 % (ref 0.0–7.0)
LYMPH%: 20.3 % (ref 14.0–48.0)
MCH: 27 pg (ref 26.0–34.0)
MCV: 78 fL — ABNORMAL LOW (ref 81–101)
MONO%: 6.8 % (ref 0.0–13.0)
NEUT#: 5.6 10*3/uL (ref 1.5–6.5)
Platelets: 282 10*3/uL (ref 145–400)
RDW: 14.3 % (ref 11.1–15.7)

## 2011-09-01 LAB — CHCC SATELLITE - SMEAR

## 2011-12-22 ENCOUNTER — Other Ambulatory Visit (HOSPITAL_BASED_OUTPATIENT_CLINIC_OR_DEPARTMENT_OTHER): Payer: 59 | Admitting: Lab

## 2011-12-22 ENCOUNTER — Ambulatory Visit (HOSPITAL_BASED_OUTPATIENT_CLINIC_OR_DEPARTMENT_OTHER): Payer: 59 | Admitting: Hematology & Oncology

## 2011-12-22 ENCOUNTER — Other Ambulatory Visit: Payer: Self-pay | Admitting: Hematology & Oncology

## 2011-12-22 VITALS — BP 114/78 | HR 80 | Temp 98.0°F | Ht 63.0 in | Wt 272.0 lb

## 2011-12-22 DIAGNOSIS — I82409 Acute embolism and thrombosis of unspecified deep veins of unspecified lower extremity: Secondary | ICD-10-CM

## 2011-12-22 LAB — CBC WITH DIFFERENTIAL (CANCER CENTER ONLY)
BASO%: 0.3 % (ref 0.0–2.0)
EOS%: 6.2 % (ref 0.0–7.0)
HCT: 37.1 % (ref 34.8–46.6)
LYMPH#: 2 10*3/uL (ref 0.9–3.3)
MCHC: 33.4 g/dL (ref 32.0–36.0)
NEUT#: 5.7 10*3/uL (ref 1.5–6.5)
NEUT%: 65.3 % (ref 39.6–80.0)
RDW: 14.1 % (ref 11.1–15.7)

## 2011-12-22 NOTE — Progress Notes (Signed)
CC:   L. Lupe Carney, M.D. Crist Fat Rivard, M.D.  DIAGNOSES: 1. Hypereosinophilic syndrome, in clinical remission. 2. 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  CURRENT THERAPY:  Pradaxa 150 mg p.o. b.i.d.  INTERIM HISTORY:  Ms. Barbato comes in for followup.  We now see her every 4 months.  She is really doing incredibly well.  She has had no problems since we last saw her.  Her son, who is 44 years old, is doing quite well.  He is doing well in school.  He enjoyed the holidays.  She is working without any difficulties.  She is doing quite nicely on the Pradaxa.  She has not had any complications from the Pradaxa.  There has been no bleeding with Pradaxa.  PHYSICAL EXAM:  General:  This is a well-developed, well-nourished white female in no obvious distress.  Vital Signs:  Temperature 98, pulse 80, respiratory rate 18, blood pressure 114/78.  Weight is 272.  Head/Neck: Exam shows a normocephalic, atraumatic skull.  There are no ocular or oral lesions.  There are no palpable cervical or supraclavicular lymph nodes.  Lungs:  Clear bilaterally.  Cardiac:  Regular rate and rhythm with a normal S1, S2.  There are no murmurs, rubs or bruits.  Abdomen: Soft with good bowel sounds.  She is somewhat obese.  She has no palpable hepatosplenomegaly.  Extremities:  No clubbing, cyanosis or edema.  Neurologic: Exam shows no focal neurological deficits.  Skin: Exam shows no rashes.  She has no ecchymoses or petechiae.  LABORATORY STUDIES:  White cell count is 8.7, hemoglobin 12.4, hematocrit 37.1, platelet count is 251.  White cell differential shows 65 segs, 22 lymphocytes, 5 monocytes, 6 eosinophils.  Peripheral smear shows a normochromic, normocytic population of red blood cells.  There are no nucleated red blood cells.  There are no teardrop cells.  There are no target cells.  White cells appear normal in morphology and maturation.  I do not see any immature myeloid or lymphoid  forms.  There is no excess eosinophils.  Platelets are adequate in number and size.  IMPRESSION:  Mr. Pollman is a 46 year old white female with hypereosinophilic syndrome.  She presented 3 years ago.  She was treated with prednisone.  She has responded incredibly well.  She has been off prednisone now for several months. I think at this point now, we can get her off Pradaxa and just put her on aspirin.  I think that aspirin clearly has been shown to help decrease the recurrence of DVTs.  If she does have another DVT, then we will have to put her on lifelong anticoagulation.  I would use Xarelto now.  We can get her back in another 4 months for followup.  I am just so happy that Ms. Aguinaga has done quite nicely.  She has responded as expected.    ______________________________ Josph Macho, M.D. PRE/MEDQ  D:  12/22/2011  T:  12/22/2011  Job:  [id number]

## 2011-12-22 NOTE — Progress Notes (Signed)
This office note has been dictated.

## 2011-12-23 LAB — RETICULOCYTES (CHCC): RBC.: 4.68 MIL/uL (ref 3.87–5.11)

## 2012-01-23 ENCOUNTER — Other Ambulatory Visit: Payer: Self-pay | Admitting: Obstetrics and Gynecology

## 2012-01-23 DIAGNOSIS — Z1231 Encounter for screening mammogram for malignant neoplasm of breast: Secondary | ICD-10-CM

## 2012-02-16 ENCOUNTER — Ambulatory Visit
Admission: RE | Admit: 2012-02-16 | Discharge: 2012-02-16 | Disposition: A | Discharge status: Home / Self Care | Payer: 59 | Source: Ambulatory Visit | Attending: Obstetrics and Gynecology | Admitting: Obstetrics and Gynecology

## 2012-02-16 DIAGNOSIS — Z1231 Encounter for screening mammogram for malignant neoplasm of breast: Secondary | ICD-10-CM

## 2012-04-26 ENCOUNTER — Other Ambulatory Visit (HOSPITAL_BASED_OUTPATIENT_CLINIC_OR_DEPARTMENT_OTHER): Payer: 59 | Admitting: Lab

## 2012-04-26 ENCOUNTER — Ambulatory Visit (HOSPITAL_BASED_OUTPATIENT_CLINIC_OR_DEPARTMENT_OTHER): Payer: 59 | Admitting: Hematology & Oncology

## 2012-04-26 VITALS — BP 128/82 | HR 96 | Temp 98.1°F | Ht 63.0 in | Wt 278.0 lb

## 2012-04-26 DIAGNOSIS — E119 Type 2 diabetes mellitus without complications: Secondary | ICD-10-CM

## 2012-04-26 DIAGNOSIS — I82409 Acute embolism and thrombosis of unspecified deep veins of unspecified lower extremity: Secondary | ICD-10-CM

## 2012-04-26 DIAGNOSIS — I82401 Acute embolism and thrombosis of unspecified deep veins of right lower extremity: Secondary | ICD-10-CM

## 2012-04-26 LAB — CBC WITH DIFFERENTIAL (CANCER CENTER ONLY)
EOS%: 7 % (ref 0.0–7.0)
Eosinophils Absolute: 0.7 10*3/uL — ABNORMAL HIGH (ref 0.0–0.5)
LYMPH%: 22.1 % (ref 14.0–48.0)
MCH: 26.8 pg (ref 26.0–34.0)
MCHC: 33.6 g/dL (ref 32.0–36.0)
MCV: 80 fL — ABNORMAL LOW (ref 81–101)
MONO%: 5.9 % (ref 0.0–13.0)
Platelets: 253 10*3/uL (ref 145–400)
RBC: 4.67 10*6/uL (ref 3.70–5.32)
RDW: 14.4 % (ref 11.1–15.7)

## 2012-04-26 LAB — CHCC SATELLITE - SMEAR

## 2012-04-26 NOTE — Progress Notes (Signed)
This office note has been dictated.

## 2012-04-26 NOTE — Progress Notes (Signed)
CC:   Margaret Gross, M.D. Margaret Gross, M.D.  DIAGNOSES: 1. Hypereosinophilic syndrome, clinical remission. 2. History of recurrent deep vein thrombosis of the right leg.  CURRENT THERAPY:  Aspirin, I think 81 mg p.o. daily.  INTERIM HISTORY:  Margaret Gross comes in for followup.  She is really doing well.  She is on aspirin now for the DVT.  She has had no problems with the aspirin.  She is working without any difficulties.  There has been no pruritus. She has had no fatigue.  She has had no fever.  There has been no nausea or vomiting.  She still has her monthly cycles without changes.  She is diabetic.  She is being very diligent with her blood sugar control.  PHYSICAL EXAMINATION:  This is a well-developed, well-nourished white female who is somewhat obese.  Vital signs:  Temperature of 98.1, pulse 96, respiratory rate 16, blood pressure 128/82.  Weight is 278.  Head and neck exam shows a normocephalic, atraumatic skull.  There are no ocular or oral lesions.  There are no palpable cervical or supraclavicular lymph nodes.  Lungs:  Clear bilaterally.  Cardiac: Regular rate and rhythm with a normal S1 and S2.  There are no murmurs, rubs or bruits.  Abdomen:  Soft with good bowel sounds.  There is no palpable abdominal mass.  There is no palpable hepatosplenomegaly. Extremities:  No clubbing, cyanosis or edema.  Skin:  No rashes, ecchymosis or petechia.  Back:  No tenderness over the spine, ribs, or hips.  Neurologic:  No focal neurological deficits.  LABORATORY STUDIES:  White cell count is 9.4, hemoglobin 7.5, hematocrit 37.2, platelet count 253.  White cell differential shows 7% eosinophils.  IMPRESSION:  Margaret Gross is a 46 year old white female with hypereosinophilic syndrome.  She presented back in 2010.  She is doing real well.  She has had no evidence of recurrence from my point of view.  We will continue to monitor her every few months.  I think we can probably  get her back now after the summertime.  I do not see that we need any blood work in between visits.  I do not see that we need any scans or Dopplers.    ______________________________ Josph Macho, M.D. PRE/MEDQ  D:  04/26/2012  T:  04/26/2012  Job:  [id number]

## 2012-09-20 ENCOUNTER — Ambulatory Visit (HOSPITAL_BASED_OUTPATIENT_CLINIC_OR_DEPARTMENT_OTHER): Payer: 59 | Admitting: Lab

## 2012-09-20 ENCOUNTER — Ambulatory Visit (HOSPITAL_BASED_OUTPATIENT_CLINIC_OR_DEPARTMENT_OTHER): Payer: 59 | Admitting: Hematology & Oncology

## 2012-09-20 DIAGNOSIS — D721 Eosinophilia, unspecified: Secondary | ICD-10-CM

## 2012-09-20 DIAGNOSIS — I82401 Acute embolism and thrombosis of unspecified deep veins of right lower extremity: Secondary | ICD-10-CM

## 2012-09-20 DIAGNOSIS — E119 Type 2 diabetes mellitus without complications: Secondary | ICD-10-CM

## 2012-09-20 DIAGNOSIS — I82409 Acute embolism and thrombosis of unspecified deep veins of unspecified lower extremity: Secondary | ICD-10-CM

## 2012-09-20 LAB — CBC WITH DIFFERENTIAL (CANCER CENTER ONLY)
BASO%: 0.4 % (ref 0.0–2.0)
Eosinophils Absolute: 0.6 10*3/uL — ABNORMAL HIGH (ref 0.0–0.5)
LYMPH#: 2 10*3/uL (ref 0.9–3.3)
MONO#: 0.6 10*3/uL (ref 0.1–0.9)
Platelets: 253 10*3/uL (ref 145–400)
RBC: 4.65 10*6/uL (ref 3.70–5.32)
RDW: 14.3 % (ref 11.1–15.7)
WBC: 9 10*3/uL (ref 3.9–10.0)

## 2012-09-20 LAB — CHCC SATELLITE - SMEAR

## 2012-09-20 NOTE — Patient Instructions (Signed)
Call with problems.

## 2012-09-20 NOTE — Progress Notes (Signed)
This office note has been dictated.

## 2012-09-21 LAB — D-DIMER, QUANTITATIVE: D-Dimer, Quant: 0.45 ug/mL-FEU (ref 0.00–0.48)

## 2012-09-21 NOTE — Progress Notes (Signed)
CC:   Margaret Gross, M.D. Margaret Gross, M.D.  DIAGNOSES: 1. Hypereosinophilic syndrome, clinical remission. 2. History of DVT of the right leg.  CURRENT THERAPY:  Aspirin 81 mg p.o. daily.  INTERIM HISTORY:  Margaret Gross comes in for followup.  She is doing quite well.  We last saw her back in May.  She had a great summer.  She is walking without difficulties.  Her son is doing quite well.  She also has insulin-dependent diabetes.  She is doing very well with her blood sugar control.  She has had no pruritus.  She has had no leg swelling.  She has had no rashes.  She has had no nausea or vomiting.  There has been no change in bowel or bladder habits.  The patient still has her monthly cycles.  When we last saw her in May, her D-dimer was 1.10.  PHYSICAL EXAMINATION:  This is a well-developed, well-nourished white female who is moderately obese.  Vital signs:  98.1, pulse 80, respiratory rate 18, blood pressure 112/72.  Weight is 278.  Head and neck:  Normocephalic, atraumatic skull.  There are no ocular or oral lesions.  There are no palpable cervical or supraclavicular lymph nodes. Lungs:  Clear bilaterally.  There are no rales, wheezes or rhonchi. Cardiac:  Regular rate and rhythm with a normal S1 and S2.  There are no murmurs, rubs or bruits.  Abdomen:  Soft with good bowel sounds.  There is no palpable abdominal mass.  There is no palpable hepatosplenomegaly. Back:  No tenderness over the spine, ribs, or hips.  Extremities:  No clubbing, cyanosis or edema.  She has good range of motion of her joints.  No palpable venous cord is noted in her legs.  Skin:  No rashes, ecchymoses or petechia.  Neurologic:  No focal neurological deficits.  LABORATORY STUDIES:  White cell count 9, hemoglobin 12.6, hematocrit 36.9, platelet count 253.  Eosinophils of 7%.  IMPRESSION:  Margaret Gross is a 46 year old white female with hypereosinophilic syndrome.  She presented, I think, in  January of 2010. She was treated with prednisone.  She did very, very well with prednisone.  I think we can probably get her every 6 months now.  I do not see that we need to do any blood work in between office visits.    ______________________________ Josph Macho, M.D. PRE/MEDQ  D:  09/20/2012  T:  09/21/2012  Job:  [id number]

## 2013-01-02 ENCOUNTER — Ambulatory Visit: Payer: 59 | Admitting: Obstetrics and Gynecology

## 2013-01-02 ENCOUNTER — Encounter: Payer: Self-pay | Admitting: Obstetrics and Gynecology

## 2013-01-02 VITALS — BP 134/82 | Ht 62.5 in | Wt 283.0 lb

## 2013-01-02 DIAGNOSIS — Z01419 Encounter for gynecological examination (general) (routine) without abnormal findings: Secondary | ICD-10-CM

## 2013-01-02 DIAGNOSIS — Z124 Encounter for screening for malignant neoplasm of cervix: Secondary | ICD-10-CM

## 2013-01-02 NOTE — Progress Notes (Signed)
The patient reports:no complaints  Contraception:Mirena (2015)  Last mammogram: 02/19/2012 Normal Last pap: 12/29/2011 Normal  GC/Chlamydia cultures offered: declined HIV/RPR/HbsAg offered:  declined HSV 1 and 2 glycoprotein offered: declined  Menstrual cycle regular and monthly: No:  Menstrual flow normal: No:   Urinary symptoms: none Normal bowel movements: Yes Reports abuse at home: No:   Subjective:    Margaret Gross is a 47 y.o. female, G1P0, who presents for an annual exam.     History   Social History  . Marital Status: Married    Spouse Name: N/A    Number of Children: N/A  . Years of Education: N/A   Social History Main Topics  . Smoking status: Never Smoker   . Smokeless tobacco: Never Used  . Alcohol Use: Yes     Comment: occasional   . Drug Use: No  . Sexually Active: Yes    Birth Control/ Protection: IUD   Other Topics Concern  . None   Social History Narrative  . None    Menstrual cycle:   LMP: No LMP recorded. Patient is not currently having periods (Reason: IUD).           Cycle: amenorrhea  The following portions of the patient's history were reviewed and updated as appropriate: allergies, current medications, past family history, past medical history, past social history, past surgical history and problem list.  Review of Systems Pertinent items are noted in HPI. Breast:Negative for breast lump,nipple discharge or nipple retraction Gastrointestinal: Negative for abdominal pain, change in bowel habits or rectal bleeding Urinary:negative   Objective:    BP 134/82  Ht 5' 2.5" (1.588 m)  Wt 283 lb (128.368 kg)  BMI 50.94 kg/m2    Weight:  Wt Readings from Last 1 Encounters:  01/02/13 283 lb (128.368 kg)          BMI: Body mass index is 50.94 kg/(m^2).  General Appearance: Alert, appropriate appearance for age. No acute distress HEENT: Grossly normal Neck / Thyroid: Supple, no masses, nodes or enlargement Lungs: clear to auscultation  bilaterally Back: No CVA tenderness Breast Exam: No dimpling, nipple retraction or discharge. No masses or nodes. and No masses or nodes.No dimpling, nipple retraction or discharge. Cardiovascular: Regular rate and rhythm. S1, S2, no murmur Gastrointestinal: Soft, non-tender, no masses or organomegaly Pelvic Exam: Vulva and vagina appear normal. Bimanual exam reveals normal uterus and adnexa. + strings Rectovaginal: normal rectal, no masses Lymphatic Exam: Non-palpable nodes in neck, clavicular, axillary, or inguinal regions  Skin: no rash or abnormalities Neurologic: Normal gait and speech, no tremor  Psychiatric: Alert and oriented, appropriate affect.   Assessment:    Normal gyn exam    Plan:    mammogram pap smear return annually or prn STD screening: declined Contraception:IUD MIRENA + strings   Silverio Lay MD

## 2013-01-06 LAB — PAP IG W/ RFLX HPV ASCU

## 2013-03-18 ENCOUNTER — Telehealth: Payer: Self-pay | Admitting: Hematology & Oncology

## 2013-03-18 NOTE — Telephone Encounter (Signed)
Pt moved 4-4 to 4-11

## 2013-03-21 ENCOUNTER — Other Ambulatory Visit: Payer: 59 | Admitting: Lab

## 2013-03-21 ENCOUNTER — Ambulatory Visit: Payer: 59 | Admitting: Hematology & Oncology

## 2013-03-28 ENCOUNTER — Ambulatory Visit (HOSPITAL_BASED_OUTPATIENT_CLINIC_OR_DEPARTMENT_OTHER): Payer: 59 | Admitting: Hematology & Oncology

## 2013-03-28 ENCOUNTER — Other Ambulatory Visit (HOSPITAL_BASED_OUTPATIENT_CLINIC_OR_DEPARTMENT_OTHER): Payer: 59 | Admitting: Lab

## 2013-03-28 VITALS — BP 120/76 | HR 84 | Temp 98.0°F | Resp 16 | Ht 62.0 in | Wt 279.0 lb

## 2013-03-28 DIAGNOSIS — Z86718 Personal history of other venous thrombosis and embolism: Secondary | ICD-10-CM

## 2013-03-28 DIAGNOSIS — Z7901 Long term (current) use of anticoagulants: Secondary | ICD-10-CM

## 2013-03-28 DIAGNOSIS — I82409 Acute embolism and thrombosis of unspecified deep veins of unspecified lower extremity: Secondary | ICD-10-CM

## 2013-03-28 LAB — D-DIMER, QUANTITATIVE: D-Dimer, Quant: 0.67 ug/mL-FEU — ABNORMAL HIGH (ref 0.00–0.48)

## 2013-03-28 LAB — CBC WITH DIFFERENTIAL (CANCER CENTER ONLY)
Eosinophils Absolute: 0.7 10*3/uL — ABNORMAL HIGH (ref 0.0–0.5)
HCT: 36.9 % (ref 34.8–46.6)
LYMPH%: 23.4 % (ref 14.0–48.0)
MCH: 26.6 pg (ref 26.0–34.0)
MCV: 80 fL — ABNORMAL LOW (ref 81–101)
MONO%: 6.9 % (ref 0.0–13.0)
NEUT%: 61.1 % (ref 39.6–80.0)
Platelets: 253 10*3/uL (ref 145–400)
RDW: 14.2 % (ref 11.1–15.7)

## 2013-03-28 NOTE — Progress Notes (Signed)
This office note has been dictated.
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
## 2013-04-14 ENCOUNTER — Other Ambulatory Visit: Payer: Self-pay

## 2013-04-14 DIAGNOSIS — Z1231 Encounter for screening mammogram for malignant neoplasm of breast: Secondary | ICD-10-CM

## 2013-04-18 ENCOUNTER — Ambulatory Visit: Payer: 59

## 2013-09-26 ENCOUNTER — Other Ambulatory Visit (HOSPITAL_BASED_OUTPATIENT_CLINIC_OR_DEPARTMENT_OTHER): Payer: 59 | Admitting: Lab

## 2013-09-26 ENCOUNTER — Ambulatory Visit (HOSPITAL_BASED_OUTPATIENT_CLINIC_OR_DEPARTMENT_OTHER): Payer: 59 | Admitting: Hematology & Oncology

## 2013-09-26 VITALS — BP 135/69 | HR 89 | Temp 98.2°F | Resp 14 | Ht 62.0 in | Wt 281.0 lb

## 2013-09-26 LAB — CBC WITH DIFFERENTIAL (CANCER CENTER ONLY)
BASO#: 0.1 10*3/uL (ref 0.0–0.2)
BASO%: 0.6 % (ref 0.0–2.0)
EOS%: 12.2 % — ABNORMAL HIGH (ref 0.0–7.0)
HCT: 37.9 % (ref 34.8–46.6)
HGB: 12.3 g/dL (ref 11.6–15.9)
LYMPH#: 1.9 10*3/uL (ref 0.9–3.3)
LYMPH%: 21.1 % (ref 14.0–48.0)
MCH: 26.2 pg (ref 26.0–34.0)
MCHC: 32.5 g/dL (ref 32.0–36.0)
MONO%: 4.7 % (ref 0.0–13.0)
NEUT%: 61.4 % (ref 39.6–80.0)
RDW: 14.5 % (ref 11.1–15.7)

## 2013-09-26 NOTE — Progress Notes (Signed)
This office note has been dictated.

## 2013-09-27 NOTE — Progress Notes (Signed)
CC:   Margaret Gross, M.D.  DIAGNOSES: 1. Hypereosinophilic syndrome. 2. History of deep venous thrombosis of the right leg.  CURRENT THERAPY:  Aspirin 81 mg p.o. q. day.  INTERIM HISTORY:  Margaret Gross comes in for her followup.  We last saw her back in April.  Since then, she has been pretty busy.  They have moved into a new house.  They are still trying to get settled in.  She has had no problems with itching.  Her blood sugars have been under fairly good control.  She has had no cough or shortness of breath.  There has been no bony pain.  There has been no change in bowel or bladder habits.  She had her last mammogram in December 2013.  She is due for another one this year.  She has not noticed any problems with leg swelling or leg pain, particularly with the right leg.  Her last D-dimer back in April was 0.67.  PHYSICAL EXAMINATION:  General:  This is a mildly obese white female in no obvious distress.  Vital signs:  Temperature of 98.2, pulse 89, respiratory rate 14, blood pressure 135/69.  Weight is 281 pounds.  Head and neck:  Normocephalic, atraumatic skull.  There are no ocular or oral lesions.  There are no palpable cervical or supraclavicular lymph nodes. Lungs:  Clear bilaterally.  Cardiac:  Regular rate and rhythm, with a normal S1 and S2.  There are no murmurs, rubs, or bruits.  Abdomen: Soft.  She has good bowel sounds.  There is no fluid wave.  There is no palpable abdominal mass.  There is no palpable hepatosplenomegaly. Extremities:  Show no clubbing, cyanosis or edema.  She has no palpable venous cord in her right lower leg.  She has a negative Homan's sign. She has good pulses in her distal extremities.  Skin:  Shows no rashes, ecchymosis, or petechia.  Neurological:  Shows no focal neurological deficits.  LABORATORY STUDIES:  White cell count 9, hemoglobin 12.3, hematocrit 37.9, platelet count 269.  She has 12% eosinophils.  IMPRESSION:  Margaret Gross is a  very charming 47 year old white female.  She has a history of hypereosinophilic syndrome.  She presented back in January 2010.  We put her on prednisone.  We got to a remission with prednisone.  The patient has been off prednisone now for about a year- and-a-half.  The eosinophils are creeping up a little bit.  We are going to have to watch this very closely.  I do want to get her back in 6 more months.  I do not see any issues with respect to her having a DVT.  She is on aspirin for this.    ______________________________ Josph Macho, M.D. PRE/MEDQ  D:  09/26/2013  T:  09/27/2013  Job:  [id number]

## 2013-10-15 ENCOUNTER — Ambulatory Visit
Admission: RE | Admit: 2013-10-15 | Discharge: 2013-10-15 | Disposition: A | Discharge status: Home / Self Care | Payer: 59 | Source: Ambulatory Visit | Attending: Family Medicine | Admitting: Family Medicine

## 2013-10-15 ENCOUNTER — Other Ambulatory Visit: Payer: Self-pay | Admitting: Family Medicine

## 2013-10-15 DIAGNOSIS — R05 Cough: Secondary | ICD-10-CM

## 2014-01-09 ENCOUNTER — Telehealth: Payer: Self-pay | Admitting: Hematology & Oncology

## 2014-01-09 NOTE — Telephone Encounter (Signed)
Pt aware moved 4-10 to 4-6

## 2014-03-19 ENCOUNTER — Other Ambulatory Visit: Payer: 59 | Admitting: Lab

## 2014-03-19 ENCOUNTER — Ambulatory Visit: Payer: 59 | Admitting: Hematology & Oncology

## 2014-03-23 ENCOUNTER — Encounter: Payer: Self-pay | Admitting: Hematology & Oncology

## 2014-03-23 ENCOUNTER — Ambulatory Visit (HOSPITAL_BASED_OUTPATIENT_CLINIC_OR_DEPARTMENT_OTHER): Payer: 59 | Admitting: Hematology & Oncology

## 2014-03-23 ENCOUNTER — Other Ambulatory Visit (HOSPITAL_BASED_OUTPATIENT_CLINIC_OR_DEPARTMENT_OTHER): Payer: 59 | Admitting: Lab

## 2014-03-23 VITALS — BP 108/71 | HR 95 | Temp 97.5°F | Resp 14 | Ht 64.0 in | Wt 269.0 lb

## 2014-03-23 DIAGNOSIS — D721 Eosinophilia, unspecified: Secondary | ICD-10-CM

## 2014-03-23 DIAGNOSIS — D649 Anemia, unspecified: Secondary | ICD-10-CM

## 2014-03-23 LAB — CBC WITH DIFFERENTIAL (CANCER CENTER ONLY)
BASO#: 0 10*3/uL (ref 0.0–0.2)
BASO%: 0.4 % (ref 0.0–2.0)
EOS%: 8.9 % — AB (ref 0.0–7.0)
Eosinophils Absolute: 0.9 10*3/uL — ABNORMAL HIGH (ref 0.0–0.5)
HEMATOCRIT: 39.3 % (ref 34.8–46.6)
HGB: 12.9 g/dL (ref 11.6–15.9)
LYMPH#: 1.5 10*3/uL (ref 0.9–3.3)
LYMPH%: 15.3 % (ref 14.0–48.0)
MCH: 26.1 pg (ref 26.0–34.0)
MCHC: 32.8 g/dL (ref 32.0–36.0)
MCV: 79 fL — AB (ref 81–101)
MONO#: 0.5 10*3/uL (ref 0.1–0.9)
MONO%: 5.4 % (ref 0.0–13.0)
NEUT#: 7 10*3/uL — ABNORMAL HIGH (ref 1.5–6.5)
NEUT%: 70 % (ref 39.6–80.0)
Platelets: 268 10*3/uL (ref 145–400)
RBC: 4.95 10*6/uL (ref 3.70–5.32)
RDW: 14.9 % (ref 11.1–15.7)
WBC: 10 10*3/uL (ref 3.9–10.0)

## 2014-03-23 LAB — CHCC SATELLITE - SMEAR

## 2014-03-23 NOTE — Progress Notes (Signed)
Hematology and Oncology Follow Up Visit  Margaret Gross [id number] Aug 31, 1966 48 y.o. 03/23/2014   Principle Diagnosis:  Hypereosinophilic syndrome DVT of the right leg-resolved Current Therapy:    Aspirin 81 mg by mouth daily     Interim History:  Ms.  Margaret Gross is back for followup. Her serum 6 months. Pressure been doing well. She is a diabetic. She is on insulin. This does seem to be controlling her blood sugars pre-well.  She's had no itching. There's been no rashes.  Her skin still is a little dry.  She's had no problems with cough shortness of breath. She's had no change in bowel or bladder habits. She does get her yearly mammograms and her female exams. She says that she is due this month.  Medications: Current outpatient prescriptions:aspirin 325 MG tablet, Take 325 mg by mouth 2 (two) times daily., Disp: , Rfl: ;  Dapagliflozin Propanediol (FARXIGA) 5 MG TABS, Take by mouth every morning., Disp: , Rfl: ;  econazole nitrate 1 % cream, Apply 1 application topically as needed. , Disp: , Rfl: ;  fluticasone (FLONASE) 50 MCG/ACT nasal spray, Take 50 mcg by mouth as needed., Disp: , Rfl:  griseofulvin (GRIFULVIN V) 500 MG tablet, Take 500 mg by mouth daily. , Disp: , Rfl: ;  JANUVIA 100 MG tablet, 100 mg daily. , Disp: , Rfl: ;  Lancets (ONETOUCH ULTRASOFT) lancets, , Disp: , Rfl: ;  LANTUS SOLOSTAR 100 UNIT/ML injection, 100 mLs. 75 units at bedtime, Disp: , Rfl: ;  levonorgestrel (MIRENA) 20 MCG/24HR IUD, 1 each by Intrauterine route once., Disp: , Rfl:  metFORMIN (GLUMETZA) 500 MG (MOD) 24 hr tablet, Take 500 mg by mouth 2 (two) times daily with a meal. 2 tabs bid, Disp: , Rfl: ;  quinapril-hydrochlorothiazide (ACCURETIC) 20-25 MG per tablet, Take 20-25 mg by mouth Daily., Disp: , Rfl: ;  SUMAtriptan (IMITREX) 100 MG tablet, Take 100 mg by mouth as needed. , Disp: , Rfl:   Allergies:  Allergies  Allergen Reactions  . Ciprofloxacin Hcl     Past Medical History, Surgical history,  Social history, and Family History were reviewed and updated.  Review of Systems: As above  Physical Exam:  height is 5\' 4"  (1.626 m) and weight is 269 lb (122.018 kg). Her oral temperature is 97.5 F (36.4 C). Her blood pressure is 108/71 and her pulse is 95. Her respiration is 14.   Somewhat obese white female. Head and neck exam shows no ocular or oral lesions. Shows a probable cervical or supraclavicular lymph nodes. Lungs are clear. Cardiac exam regular in rhythm with no murmurs rubs or bruits. Abdomen is soft. Has good bowel sounds. She is mildly obese. She has no fluid wave. There is no palpable hepatosplenomegaly. Back exam no tenderness over the spine ribs or hips. Extremities shows no clubbing cyanosis or edema. She has good range of motion of her joints. There may be some slight swelling of the right lower leg. No venous cord is noted in the right calf. She has good pulses. Skin exam slightly dry. There are no rashes. Neurological exam is unremarkable. Lab Results  Component Value Date   WBC 10.0 03/23/2014   HGB 12.9 03/23/2014   HCT 39.3 03/23/2014   MCV 79* 03/23/2014   PLT 268 03/23/2014     Chemistry      Component Value Date/Time   NA 138 11/26/2009 0012   K 3.1* 11/26/2009 0012   CL 100 11/26/2009 0012   CO2 27  11/26/2009 0012   BUN 11 11/26/2009 0012   CREATININE 0.72 11/26/2009 0012      Component Value Date/Time   CALCIUM 8.9 11/26/2009 0012   ALKPHOS 77 09/28/2009 0837   AST 15 09/28/2009 0837   ALT 20 09/28/2009 0837   BILITOT 0.5 09/28/2009 0837         Impression and Plan: Margaret Gross is 48 year old white female with history of hypereosinophilic syndrome. She presented in January 2010. She responded well to prednisone. So far, she's had no issues since been off prednisone. She's been off her prednisone now for him over a year and a half to 2 years.  On her blood smear, I do not see any thing that was unusual. The i eosinophils were slightly increased in  number. They appeared to be normal in morphology and maturity by exam. She had normal Red cell appearance. Platelets also looked okay.  I'll plan to get her back in 6 months now. I do not see that we need any blood work in between visits.   Margaret Macho, MD 4/6/20159:08 AM

## 2014-03-24 LAB — D-DIMER, QUANTITATIVE: D-Dimer, Quant: 0.46 ug/mL-FEU (ref 0.00–0.48)

## 2014-03-24 LAB — LACTATE DEHYDROGENASE: LDH: 166 U/L (ref 94–250)

## 2014-03-27 ENCOUNTER — Ambulatory Visit: Payer: 59 | Admitting: Hematology & Oncology

## 2014-03-27 ENCOUNTER — Other Ambulatory Visit: Payer: 59 | Admitting: Lab

## 2014-04-10 ENCOUNTER — Other Ambulatory Visit: Payer: Self-pay | Admitting: Obstetrics and Gynecology

## 2014-04-10 DIAGNOSIS — Z1231 Encounter for screening mammogram for malignant neoplasm of breast: Secondary | ICD-10-CM

## 2014-04-27 ENCOUNTER — Ambulatory Visit
Admission: RE | Admit: 2014-04-27 | Discharge: 2014-04-27 | Disposition: A | Discharge status: Home / Self Care | Payer: 59 | Source: Ambulatory Visit | Attending: Obstetrics and Gynecology | Admitting: Obstetrics and Gynecology

## 2014-04-27 DIAGNOSIS — Z1231 Encounter for screening mammogram for malignant neoplasm of breast: Secondary | ICD-10-CM

## 2014-09-21 ENCOUNTER — Encounter: Payer: Self-pay | Admitting: Family

## 2014-09-21 ENCOUNTER — Ambulatory Visit (HOSPITAL_BASED_OUTPATIENT_CLINIC_OR_DEPARTMENT_OTHER): Payer: 59 | Admitting: Family

## 2014-09-21 ENCOUNTER — Other Ambulatory Visit (HOSPITAL_BASED_OUTPATIENT_CLINIC_OR_DEPARTMENT_OTHER): Payer: 59 | Admitting: Lab

## 2014-09-21 VITALS — BP 127/67 | HR 92 | Temp 98.3°F | Resp 16 | Ht 64.0 in | Wt 268.0 lb

## 2014-09-21 DIAGNOSIS — E119 Type 2 diabetes mellitus without complications: Secondary | ICD-10-CM

## 2014-09-21 DIAGNOSIS — D721 Eosinophilia, unspecified: Secondary | ICD-10-CM

## 2014-09-21 DIAGNOSIS — D649 Anemia, unspecified: Secondary | ICD-10-CM

## 2014-09-21 LAB — COMPREHENSIVE METABOLIC PANEL
ALT: 18 U/L (ref 0–35)
AST: 14 U/L (ref 0–37)
Albumin: 3.9 g/dL (ref 3.5–5.2)
Alkaline Phosphatase: 72 U/L (ref 39–117)
BUN: 11 mg/dL (ref 6–23)
CO2: 26 meq/L (ref 19–32)
CREATININE: 0.83 mg/dL (ref 0.50–1.10)
Calcium: 8.9 mg/dL (ref 8.4–10.5)
Chloride: 102 mEq/L (ref 96–112)
GLUCOSE: 131 mg/dL — AB (ref 70–99)
Potassium: 3.7 mEq/L (ref 3.5–5.3)
Sodium: 137 mEq/L (ref 135–145)
Total Bilirubin: 0.4 mg/dL (ref 0.2–1.2)
Total Protein: 6.4 g/dL (ref 6.0–8.3)

## 2014-09-21 LAB — CBC WITH DIFFERENTIAL (CANCER CENTER ONLY)
BASO#: 0.1 10*3/uL (ref 0.0–0.2)
BASO%: 0.6 % (ref 0.0–2.0)
EOS%: 8.8 % — AB (ref 0.0–7.0)
Eosinophils Absolute: 0.8 10*3/uL — ABNORMAL HIGH (ref 0.0–0.5)
HCT: 38.6 % (ref 34.8–46.6)
HGB: 12.8 g/dL (ref 11.6–15.9)
LYMPH#: 2.2 10*3/uL (ref 0.9–3.3)
LYMPH%: 25.8 % (ref 14.0–48.0)
MCH: 26.2 pg (ref 26.0–34.0)
MCHC: 33.2 g/dL (ref 32.0–36.0)
MCV: 79 fL — ABNORMAL LOW (ref 81–101)
MONO#: 0.5 10*3/uL (ref 0.1–0.9)
MONO%: 5.3 % (ref 0.0–13.0)
NEUT%: 59.5 % (ref 39.6–80.0)
NEUTROS ABS: 5.1 10*3/uL (ref 1.5–6.5)
Platelets: 288 10*3/uL (ref 145–400)
RBC: 4.88 10*6/uL (ref 3.70–5.32)
RDW: 14.6 % (ref 11.1–15.7)
WBC: 8.6 10*3/uL (ref 3.9–10.0)

## 2014-09-21 LAB — IRON AND TIBC CHCC
%SAT: 11 % — ABNORMAL LOW (ref 21–57)
IRON: 34 ug/dL — AB (ref 41–142)
TIBC: 318 ug/dL (ref 236–444)
UIBC: 284 ug/dL (ref 120–384)

## 2014-09-21 LAB — CHCC SATELLITE - SMEAR

## 2014-09-21 LAB — FERRITIN CHCC: FERRITIN: 20 ng/mL (ref 9–269)

## 2014-09-21 NOTE — Progress Notes (Signed)
[facility]  [phone] [phone]  ID: Margaret Gross OB: 27-Apr-1966 MR#: [id number] CSN#:[id number] Patient Care Team: Donnie Coffin, MD as PCP - General (Family Medicine)  DIAGNOSIS: Hypereosinophilic syndrome  DVT of the right leg-resolved  INTERVAL HISTORY: Margaret Gross is here today for her follow-up. She is doing ok just gets tired at times. Her eosinophils are 8.8% today. She denies fever, chills, n/v, cough, itching, rash, headache, dizziness, SOB, chest pain, palpitations, abdominal pain, constipation, diarrhea, blood in urine or stool. No swelling, tenderness, numbness or tingling in her extremities. No pain or bleeding. Her appetite is good and she is drinking plenty of fluids.  Her blood sugars are doing better. Her mammogram in March was negative.   CURRENT TREATMENT: Aspirin 81 mg by mouth daily  REVIEW OF SYSTEMS: All other 10 point review of systems is negative.   PAST MEDICAL HISTORY: Past Medical History  Diagnosis Date  . Ligament tear     right arm at age 72  . Hx of migraines    PAST SURGICAL HISTORY: Past Surgical History  Procedure Laterality Date  . Wisdom tooth extraction      age 15  . Cholecystectomy  2002  . Cesarean section     FAMILY HISTORY Family History  Problem Relation Age of Onset  . Heart attack Father   . Diabetes Father   . Cancer Maternal Grandmother   . Heart attack Paternal Grandfather     paranoid  . Schizophrenia Other    GYNECOLOGIC HISTORY:  No LMP recorded. Patient is not currently having periods (Reason: IUD).   SOCIAL HISTORY:  History   Social History  . Marital Status: Married    Spouse Name: N/A    Number of Children: N/A  . Years of Education: N/A   Occupational History  . Not on file.   Social History Main Topics  . Smoking status: Never Smoker   . Smokeless tobacco: Never Used     Comment: never used tobacco  . Alcohol Use: Yes     Comment: occasional   . Drug Use: No   . Sexual Activity: Yes    Birth Control/ Protection: IUD   Other Topics Concern  . Not on file   Social History Narrative  . No narrative on file   ADVANCED DIRECTIVES: <no information>  HEALTH MAINTENANCE: History  Substance Use Topics  . Smoking status: Never Smoker   . Smokeless tobacco: Never Used     Comment: never used tobacco  . Alcohol Use: Yes     Comment: occasional    Colonoscopy: PAP: Bone density: Lipid panel:  Allergies  Allergen Reactions  . Ciprofloxacin Hcl    Current Outpatient Prescriptions  Medication Sig Dispense Refill  . aspirin 325 MG tablet Take 325 mg by mouth 2 (two) times daily.      . Dapagliflozin Propanediol (FARXIGA) 5 MG TABS Take by mouth every morning.      Marland Kitchen econazole nitrate 1 % cream Apply 1 application topically as needed.       . fluticasone (FLONASE) 50 MCG/ACT nasal spray Take 50 mcg by mouth as needed.      Marland Kitchen JANUVIA 100 MG tablet 100 mg daily.       . Lancets (ONETOUCH ULTRASOFT) lancets       . LANTUS SOLOSTAR 100 UNIT/ML injection 100 mLs. 75 units at bedtime      . levonorgestrel (MIRENA) 20 MCG/24HR IUD 1 each by Intrauterine route once.      Marland Kitchen  metFORMIN (GLUMETZA) 500 MG (MOD) 24 hr tablet Take 500 mg by mouth 2 (two) times daily with a meal. 2 tabs bid      . quinapril-hydrochlorothiazide (ACCURETIC) 20-25 MG per tablet Take 20-25 mg by mouth Daily.      . SUMAtriptan (IMITREX) 100 MG tablet Take 100 mg by mouth as needed.        No current facility-administered medications for this visit.   OBJECTIVE: Filed Vitals:   09/21/14 0910  BP: 127/67  Pulse: 92  Temp: 98.3 F (36.8 C)  Resp: 16   Body mass index is 45.98 kg/(m^2). ECOG FS:0 - Asymptomatic Ocular: Sclerae unicteric, pupils equal, round and reactive to light Ear-nose-throat: Oropharynx clear, dentition fair Lymphatic: No cervical or supraclavicular adenopathy Lungs no rales or rhonchi, good excursion bilaterally Heart regular rate and rhythm, no  murmur appreciated Abd soft, nontender, positive bowel sounds MSK no focal spinal tenderness, no joint edema Neuro: non-focal, well-oriented, appropriate affect Breasts: Deferred  LAB RESULTS: CMP     Component Value Date/Time   NA 138 11/26/2009 0012   K 3.1* 11/26/2009 0012   CL 100 11/26/2009 0012   CO2 27 11/26/2009 0012   GLUCOSE 132* 11/26/2009 0012   BUN 11 11/26/2009 0012   CREATININE 0.72 11/26/2009 0012   CALCIUM 8.9 11/26/2009 0012   PROT 6.8 09/28/2009 0837   ALBUMIN 4.2 09/28/2009 0837   AST 15 09/28/2009 0837   ALT 20 09/28/2009 0837   ALKPHOS 77 09/28/2009 0837   BILITOT 0.5 09/28/2009 0837   GFRNONAA >60 11/26/2009 0012   GFRAA  Value: >60        The eGFR has been calculated using the MDRD equation. This calculation has not been validated in all clinical situations. eGFR's persistently <60 mL/min signify possible Chronic Kidney Disease. 11/26/2009 0012   No results found for this basename: SPEP, UPEP,  kappa and lambda light chains   Lab Results  Component Value Date   WBC 8.6 09/21/2014   NEUTROABS 5.1 09/21/2014   HGB 12.8 09/21/2014   HCT 38.6 09/21/2014   MCV 79* 09/21/2014   PLT 288 09/21/2014   No results found for this basename: LABCA2   No components found with this basename: ULGSP324   No results found for this basename: INR,  in the last 168 hours  STUDIES: No results found.  ASSESSMENT/PLAN: Margaret Gross is a 48 year old white female with history of hypereosinophilic syndrome. She first presented here in January 2010 and responded well to prednisone. She has had no issues since stopping her prednisone 2 years ago. She seems to be doing quite well.  Her CBC today was normal. We will see her back in 6 months for labs and follow-up.  She knows to call here with any questions or concerns and to go to the ED in the event of an emergency. We can certainly see her sooner if need be.   Eliezer Bottom, NP 09/21/2014 10:22 AM

## 2014-10-05 ENCOUNTER — Ambulatory Visit (HOSPITAL_BASED_OUTPATIENT_CLINIC_OR_DEPARTMENT_OTHER): Payer: 59

## 2014-10-05 VITALS — BP 118/70 | HR 82 | Temp 98.0°F | Resp 18

## 2014-10-05 DIAGNOSIS — D721 Eosinophilia, unspecified: Secondary | ICD-10-CM

## 2014-10-05 MED ORDER — SODIUM CHLORIDE 0.9 % IV SOLN
1020.0000 mg | Freq: Once | INTRAVENOUS | Status: AC
  Administered 2014-10-05: 1020 mg via INTRAVENOUS
  Filled 2014-10-05: qty 34

## 2014-10-05 MED ORDER — SODIUM CHLORIDE 0.9 % IV SOLN
INTRAVENOUS | Status: DC
  Administered 2014-10-05: 09:00:00 via INTRAVENOUS

## 2014-10-05 NOTE — Patient Instructions (Signed)

## 2014-10-19 ENCOUNTER — Encounter: Payer: Self-pay | Admitting: Family

## 2015-03-22 ENCOUNTER — Other Ambulatory Visit: Payer: 59

## 2015-03-22 ENCOUNTER — Ambulatory Visit: Payer: Self-pay | Admitting: Hematology & Oncology

## 2015-03-29 ENCOUNTER — Ambulatory Visit: Payer: Self-pay | Admitting: Hematology & Oncology

## 2015-03-29 ENCOUNTER — Other Ambulatory Visit: Payer: 59

## 2015-04-09 ENCOUNTER — Other Ambulatory Visit (HOSPITAL_BASED_OUTPATIENT_CLINIC_OR_DEPARTMENT_OTHER): Payer: 59

## 2015-04-09 ENCOUNTER — Ambulatory Visit (HOSPITAL_BASED_OUTPATIENT_CLINIC_OR_DEPARTMENT_OTHER): Payer: 59 | Admitting: Family

## 2015-04-09 ENCOUNTER — Encounter: Payer: Self-pay | Admitting: Family

## 2015-04-09 VITALS — BP 125/80 | Temp 97.9°F | Resp 16 | Ht 64.0 in | Wt 273.0 lb

## 2015-04-09 DIAGNOSIS — D721 Eosinophilia, unspecified: Secondary | ICD-10-CM

## 2015-04-09 LAB — CBC WITH DIFFERENTIAL (CANCER CENTER ONLY)
BASO#: 0.1 10*3/uL (ref 0.0–0.2)
BASO%: 0.6 % (ref 0.0–2.0)
EOS%: 6 % (ref 0.0–7.0)
Eosinophils Absolute: 0.6 10*3/uL — ABNORMAL HIGH (ref 0.0–0.5)
HEMATOCRIT: 44.4 % (ref 34.8–46.6)
HGB: 15.4 g/dL (ref 11.6–15.9)
LYMPH#: 2 10*3/uL (ref 0.9–3.3)
LYMPH%: 19.2 % (ref 14.0–48.0)
MCH: 29.4 pg (ref 26.0–34.0)
MCHC: 34.7 g/dL (ref 32.0–36.0)
MCV: 85 fL (ref 81–101)
MONO#: 0.6 10*3/uL (ref 0.1–0.9)
MONO%: 6 % (ref 0.0–13.0)
NEUT%: 68.2 % (ref 39.6–80.0)
NEUTROS ABS: 7 10*3/uL — AB (ref 1.5–6.5)
PLATELETS: 275 10*3/uL (ref 145–400)
RBC: 5.23 10*6/uL (ref 3.70–5.32)
RDW: 13.3 % (ref 11.1–15.7)
WBC: 10.3 10*3/uL — AB (ref 3.9–10.0)

## 2015-04-09 LAB — COMPREHENSIVE METABOLIC PANEL
ALT: 25 U/L (ref 0–35)
AST: 17 U/L (ref 0–37)
Albumin: 4.1 g/dL (ref 3.5–5.2)
Alkaline Phosphatase: 81 U/L (ref 39–117)
BUN: 15 mg/dL (ref 6–23)
CALCIUM: 9.2 mg/dL (ref 8.4–10.5)
CO2: 24 meq/L (ref 19–32)
Chloride: 99 mEq/L (ref 96–112)
Creatinine, Ser: 0.85 mg/dL (ref 0.50–1.10)
Glucose, Bld: 166 mg/dL — ABNORMAL HIGH (ref 70–99)
Potassium: 3.8 mEq/L (ref 3.5–5.3)
SODIUM: 137 meq/L (ref 135–145)
TOTAL PROTEIN: 6.9 g/dL (ref 6.0–8.3)
Total Bilirubin: 0.5 mg/dL (ref 0.2–1.2)

## 2015-04-09 LAB — IRON AND TIBC
%SAT: 17 % — ABNORMAL LOW (ref 20–55)
IRON: 51 ug/dL (ref 42–145)
TIBC: 307 ug/dL (ref 250–470)
UIBC: 256 ug/dL (ref 125–400)

## 2015-04-09 LAB — FERRITIN: FERRITIN: 96 ng/mL (ref 10–291)

## 2015-04-09 LAB — CHCC SATELLITE - SMEAR

## 2015-04-09 NOTE — Progress Notes (Signed)
Hematology and Oncology Follow Up Visit  Margaret Gross [id number] Mar 02, 1966 49 y.o. 04/09/2015   Principle Diagnosis:  Hypereosinophilic syndrome  DVT of the right leg-resolved  Current Therapy:   Aspirin 81 mg by mouth daily    Interim History: Margaret Gross is here today for her follow-up. She is doing well. Her eosinophils today are 6%. She has had some issues with her sinuses and has been taking Flonase and Mucinex as needed.  She denies fever, chills, n/v, cough, itching, rash, headache, dizziness, SOB, chest pain, palpitations, abdominal pain, constipation, diarrhea, blood in urine or stool.  No swelling, tenderness, numbness or tingling in her extremities. No new aches or pains.  Her appetite is good and she is drinking plenty of fluids.   She sees her PCP next month and will schedule her mammogram at that time.   Medications:    Medication List       This list is accurate as of: 04/09/15  3:19 PM.  Always use your most recent med list.               aspirin 325 MG tablet  Take 325 mg by mouth 2 (two) times daily.     FARXIGA 5 MG Tabs tablet  Generic drug:  dapagliflozin propanediol  Take by mouth every morning.     fluticasone 50 MCG/ACT nasal spray  Commonly known as:  FLONASE  Take 50 mcg by mouth as needed.     JANUVIA 100 MG tablet  Generic drug:  sitaGLIPtin  Take 100 mg by mouth daily.     LANTUS SOLOSTAR 100 UNIT/ML Solostar Pen  Generic drug:  Insulin Glargine  100 mLs. 75 units at bedtime     metFORMIN 500 MG (MOD) 24 hr tablet  Commonly known as:  GLUMETZA  Take 500 mg by mouth 2 (two) times daily with a meal. 2 tabs bid     onetouch ultrasoft lancets     quinapril-hydrochlorothiazide 20-25 MG per tablet  Commonly known as:  ACCURETIC  Take 20-25 mg by mouth Daily.     SUMAtriptan 100 MG tablet  Commonly known as:  IMITREX  Take 100 mg by mouth as needed.        Allergies:  Allergies  Allergen Reactions  . Ciprofloxacin Hcl      Past Medical History, Surgical history, Social history, and Family History were reviewed and updated.  Review of Systems: All other 10 point review of systems is negative.   Physical Exam:  height is 5\' 4"  (1.626 m) and weight is 273 lb (123.832 kg). Her oral temperature is 97.9 F (36.6 C). Her blood pressure is 125/80. Her respiration is 16.   Wt Readings from Last 3 Encounters:  04/09/15 273 lb (123.832 kg)  09/21/14 268 lb (121.564 kg)  03/23/14 269 lb (122.018 kg)    Ocular: Sclerae unicteric, pupils equal, round and reactive to light Ear-nose-throat: Oropharynx clear, dentition fair Lymphatic: No cervical or supraclavicular adenopathy Lungs no rales or rhonchi, good excursion bilaterally Heart regular rate and rhythm, no murmur appreciated Abd soft, nontender, positive bowel sounds MSK no focal spinal tenderness, no joint edema Neuro: non-focal, well-oriented, appropriate affect Breasts: Deferred  Lab Results  Component Value Date   WBC 10.3* 04/09/2015   HGB 15.4 04/09/2015   HCT 44.4 04/09/2015   MCV 85 04/09/2015   PLT 275 04/09/2015   Lab Results  Component Value Date   FERRITIN 20 09/21/2014   IRON 34* 09/21/2014   TIBC  318 09/21/2014   UIBC 284 09/21/2014   IRONPCTSAT 11* 09/21/2014   Lab Results  Component Value Date   RETICCTPCT 1.2 12/22/2011   RBC 5.23 04/09/2015   RETICCTABS 56.2 12/22/2011   No results found for: KPAFRELGTCHN, LAMBDASER, KAPLAMBRATIO No results found for: IGGSERUM, IGA, IGMSERUM No results found for: Marda Stalker, SPEI   Chemistry      Component Value Date/Time   NA 137 09/21/2014 0843   K 3.7 09/21/2014 0843   CL 102 09/21/2014 0843   CO2 26 09/21/2014 0843   BUN 11 09/21/2014 0843   CREATININE 0.83 09/21/2014 0843      Component Value Date/Time   CALCIUM 8.9 09/21/2014 0843   ALKPHOS 72 09/21/2014 0843   AST 14 09/21/2014 0843   ALT 18 09/21/2014 0843    BILITOT 0.4 09/21/2014 0843     Impression and Plan: Margaret Gross is a 49 year old white female with history of hypereosinophilic syndrome. She first presented here in January 2010 and responded well to prednisone. She has had no issues since stopping her prednisone 2 years ago. She is still doing well. She has had some minor sinus congestion which has resolved with the use of Flonase and Mucinex.  Her eosinophils today are 6%. We will see her back in 6 months for labs and follow-up.  She knows to call here with any questions or concerns and to go to the ED in the event of an emergency. We can certainly see her sooner if need be.   Verdie Mosher, NP 4/22/20163:19 PM

## 2015-10-15 ENCOUNTER — Ambulatory Visit (HOSPITAL_BASED_OUTPATIENT_CLINIC_OR_DEPARTMENT_OTHER): Payer: 59 | Admitting: Family

## 2015-10-15 ENCOUNTER — Encounter: Payer: Self-pay | Admitting: Family

## 2015-10-15 ENCOUNTER — Other Ambulatory Visit (HOSPITAL_BASED_OUTPATIENT_CLINIC_OR_DEPARTMENT_OTHER): Payer: 59

## 2015-10-15 VITALS — BP 120/70 | HR 88 | Temp 97.9°F | Resp 18 | Ht 64.0 in | Wt 269.5 lb

## 2015-10-15 DIAGNOSIS — D721 Eosinophilia, unspecified: Secondary | ICD-10-CM

## 2015-10-15 DIAGNOSIS — D649 Anemia, unspecified: Secondary | ICD-10-CM

## 2015-10-15 DIAGNOSIS — D509 Iron deficiency anemia, unspecified: Secondary | ICD-10-CM

## 2015-10-15 LAB — COMPREHENSIVE METABOLIC PANEL (CC13)
ALT: 28 U/L (ref 0–55)
AST: 23 U/L (ref 5–34)
Albumin: 3.7 g/dL (ref 3.5–5.0)
Alkaline Phosphatase: 76 U/L (ref 40–150)
Anion Gap: 11 mEq/L (ref 3–11)
BILIRUBIN TOTAL: 0.69 mg/dL (ref 0.20–1.20)
BUN: 14.9 mg/dL (ref 7.0–26.0)
CO2: 27 mEq/L (ref 22–29)
Calcium: 9.5 mg/dL (ref 8.4–10.4)
Chloride: 101 mEq/L (ref 98–109)
Creatinine: 0.8 mg/dL (ref 0.6–1.1)
EGFR: 89 mL/min/{1.73_m2} — AB (ref >90)
GLUCOSE: 146 mg/dL — AB (ref 70–140)
POTASSIUM: 3.8 meq/L (ref 3.5–5.1)
SODIUM: 140 meq/L (ref 136–145)
TOTAL PROTEIN: 7 g/dL (ref 6.4–8.3)

## 2015-10-15 LAB — CBC WITH DIFFERENTIAL (CANCER CENTER ONLY)
BASO#: 0 10*3/uL (ref 0.0–0.2)
BASO%: 0.3 % (ref 0.0–2.0)
EOS%: 7.9 % — ABNORMAL HIGH (ref 0.0–7.0)
Eosinophils Absolute: 0.7 10*3/uL — ABNORMAL HIGH (ref 0.0–0.5)
HEMATOCRIT: 42.9 % (ref 34.8–46.6)
HEMOGLOBIN: 14.5 g/dL (ref 11.6–15.9)
LYMPH#: 1.8 10*3/uL (ref 0.9–3.3)
LYMPH%: 19.8 % (ref 14.0–48.0)
MCH: 28.4 pg (ref 26.0–34.0)
MCHC: 33.8 g/dL (ref 32.0–36.0)
MCV: 84 fL (ref 81–101)
MONO#: 0.6 10*3/uL (ref 0.1–0.9)
MONO%: 6.4 % (ref 0.0–13.0)
NEUT%: 65.6 % (ref 39.6–80.0)
NEUTROS ABS: 5.8 10*3/uL (ref 1.5–6.5)
Platelets: 260 10*3/uL (ref 145–400)
RBC: 5.11 10*6/uL (ref 3.70–5.32)
RDW: 13.9 % (ref 11.1–15.7)
WBC: 8.9 10*3/uL (ref 3.9–10.0)

## 2015-10-15 LAB — CHCC SATELLITE - SMEAR

## 2015-10-15 LAB — IRON AND TIBC CHCC
%SAT: 18 % — ABNORMAL LOW (ref 21–57)
Iron: 57 ug/dL (ref 41–142)
TIBC: 310 ug/dL (ref 236–444)
UIBC: 253 ug/dL (ref 120–384)

## 2015-10-15 LAB — FERRITIN CHCC: Ferritin: 72 ng/ml (ref 9–269)

## 2015-10-15 NOTE — Progress Notes (Signed)
Hematology and Oncology Follow Up Visit  Jobe Gibbonricia Y Crosland [id number] [birth year] 49 y.o. 10/15/2015   Principle Diagnosis:  Hypereosinophilic syndrome  DVT of the right leg-resolved  Current Therapy:   Aspirin 81 mg by mouth daily    Interim History: [name] is here today for her follow-up. She is doing well. Her eosinophils today are 7%. She is doing really well and has no complaints at this time.  She denies fever, chills, n/v, cough, itching, rash, headache, dizziness, SOB, chest pain, palpitations, abdominal pain or changes in bowel or bladder habits.  No swelling, tenderness, numbness or tingling in her extremities. She continues to take her baby aspirin daily.  Her appetite is good and she is drinking plenty of fluids.Her weight is stable. Her mammogram in May was negative.    Medications:    Medication List       This list is accurate as of: 10/15/15  9:28 AM.  Always use your most recent med list.               aspirin 325 MG tablet  Take 325 mg by mouth 2 (two) times daily.     FARXIGA 5 MG Tabs tablet  Generic drug:  dapagliflozin propanediol  Take by mouth every morning.     fluticasone 50 MCG/ACT nasal spray  Commonly known as:  FLONASE  Take 50 mcg by mouth as needed.     JANUVIA 100 MG tablet  Generic drug:  sitaGLIPtin  Take 100 mg by mouth daily.     LANTUS SOLOSTAR 100 UNIT/ML Solostar Pen  Generic drug:  Insulin Glargine  100 mLs. 75 units at bedtime     metFORMIN 500 MG (MOD) 24 hr tablet  Commonly known as:  GLUMETZA  Take 500 mg by mouth 2 (two) times daily with a meal. 2 tabs bid     onetouch ultrasoft lancets     quinapril-hydrochlorothiazide 20-25 MG tablet  Commonly known as:  ACCURETIC  Take 20-25 mg by mouth Daily.     SUMAtriptan 100 MG tablet  Commonly known as:  IMITREX  Take 100 mg by mouth as needed.        Allergies:  Allergies  Allergen Reactions  . Ciprofloxacin Hcl     Past Medical History, Surgical  history, Social history, and Family History were reviewed and updated.  Review of Systems: All other 10 point review of systems is negative.   Physical Exam:  vitals were not taken for this visit.  Wt Readings from Last 3 Encounters:  04/09/15 273 lb (123.832 kg)  09/21/14 268 lb (121.564 kg)  03/23/14 269 lb (122.018 kg)    Ocular: Sclerae unicteric, pupils equal, round and reactive to light Ear-nose-throat: Oropharynx clear, dentition fair Lymphatic: No cervical or supraclavicular adenopathy Lungs no rales or rhonchi, good excursion bilaterally Heart regular rate and rhythm, no murmur appreciated Abd soft, nontender, positive bowel sounds MSK no focal spinal tenderness, no joint edema Neuro: non-focal, well-oriented, appropriate affect Breasts: Deferred  Lab Results  Component Value Date   WBC 8.9 10/15/2015   HGB 14.5 10/15/2015   HCT 42.9 10/15/2015   MCV 84 10/15/2015   PLT 260 10/15/2015   Lab Results  Component Value Date   FERRITIN 96 04/09/2015   IRON 51 04/09/2015   TIBC 307 04/09/2015   UIBC 256 04/09/2015   IRONPCTSAT 17* 04/09/2015   Lab Results  Component Value Date   RETICCTPCT 1.2 12/22/2011   RBC 5.11 10/15/2015   RETICCTABS  56.2 12/22/2011   No results found for: KPAFRELGTCHN, LAMBDASER, KAPLAMBRATIO No results found for: IGGSERUM, IGA, IGMSERUM No results found for: Dorene Ar, A1GS, A2GS, Karn Pickler, SPEI   Chemistry      Component Value Date/Time   NA 137 04/09/2015 1350   K 3.8 04/09/2015 1350   CL 99 04/09/2015 1350   CO2 24 04/09/2015 1350   BUN 15 04/09/2015 1350   CREATININE 0.85 04/09/2015 1350      Component Value Date/Time   CALCIUM 9.2 04/09/2015 1350   ALKPHOS 81 04/09/2015 1350   AST 17 04/09/2015 1350   ALT 25 04/09/2015 1350   BILITOT 0.5 04/09/2015 1350     Impression and Plan: Ms. 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 She responded nicely to prednisone at that time and has had no problems since stopping it 3 years ago. She is asymptomatic at this time. Her eosinophils are 7%. There has been no evidence of recurrent thrombosis. She takes 1 baby aspirin daily.  We will see her back in 6 months for labs and follow-up.  She will contact us with any questions or concerns. We can certainly see her sooner if need be.   Verdie Mosher, NP 10/28/20169:28 AM

## 2015-10-18 ENCOUNTER — Telehealth: Payer: Self-pay | Admitting: *Deleted

## 2015-10-18 NOTE — Telephone Encounter (Addendum)
Patient aware of results. Appointment made.   ----- Message from Verdie MosherSarah M [place], NP sent at 10/15/2015  3:17 PM EDT ----- Regarding: iron  Please let her know her iron is low and she will need an appointment next week for an infusion. Thank you!!!  Sarah  ----- Message -----    From: [facility] Interface    Sent: 10/15/2015   9:03 AM      To: Verdie MosherSarah M [place], NP

## 2015-10-19 ENCOUNTER — Ambulatory Visit (HOSPITAL_BASED_OUTPATIENT_CLINIC_OR_DEPARTMENT_OTHER): Payer: 59

## 2015-10-19 VITALS — BP 98/60 | HR 96 | Temp 98.2°F | Resp 18

## 2015-10-19 DIAGNOSIS — D721 Eosinophilia, unspecified: Secondary | ICD-10-CM

## 2015-10-19 MED ORDER — SODIUM CHLORIDE 0.9 % IV SOLN
510.0000 mg | Freq: Once | INTRAVENOUS | Status: AC
  Administered 2015-10-19: 510 mg via INTRAVENOUS
  Filled 2015-10-19: qty 17

## 2015-10-19 NOTE — Patient Instructions (Signed)

## 2016-04-14 ENCOUNTER — Encounter: Payer: Self-pay | Admitting: Hematology & Oncology

## 2016-04-14 ENCOUNTER — Other Ambulatory Visit (HOSPITAL_BASED_OUTPATIENT_CLINIC_OR_DEPARTMENT_OTHER): Payer: 59

## 2016-04-14 ENCOUNTER — Ambulatory Visit (HOSPITAL_BASED_OUTPATIENT_CLINIC_OR_DEPARTMENT_OTHER): Payer: 59 | Admitting: Hematology & Oncology

## 2016-04-14 VITALS — BP 135/66 | HR 92 | Temp 97.9°F | Resp 20 | Ht 64.0 in | Wt 264.0 lb

## 2016-04-14 DIAGNOSIS — E119 Type 2 diabetes mellitus without complications: Secondary | ICD-10-CM

## 2016-04-14 DIAGNOSIS — D721 Eosinophilia, unspecified: Secondary | ICD-10-CM

## 2016-04-14 DIAGNOSIS — D649 Anemia, unspecified: Secondary | ICD-10-CM | POA: Diagnosis not present

## 2016-04-14 LAB — COMPREHENSIVE METABOLIC PANEL
ALBUMIN: 3.6 g/dL (ref 3.5–5.0)
ALK PHOS: 68 U/L (ref 40–150)
ALT: 28 U/L (ref 0–55)
ANION GAP: 10 meq/L (ref 3–11)
AST: 15 U/L (ref 5–34)
BILIRUBIN TOTAL: 0.48 mg/dL (ref 0.20–1.20)
BUN: 14.1 mg/dL (ref 7.0–26.0)
CALCIUM: 9.4 mg/dL (ref 8.4–10.4)
CO2: 30 mEq/L — ABNORMAL HIGH (ref 22–29)
Chloride: 100 mEq/L (ref 98–109)
Creatinine: 0.9 mg/dL (ref 0.6–1.1)
EGFR: 79 mL/min/{1.73_m2} — AB (ref >90)
GLUCOSE: 139 mg/dL (ref 70–140)
POTASSIUM: 3.9 meq/L (ref 3.5–5.1)
SODIUM: 140 meq/L (ref 136–145)
TOTAL PROTEIN: 6.8 g/dL (ref 6.4–8.3)

## 2016-04-14 LAB — IRON AND TIBC
%SAT: 17 % — ABNORMAL LOW (ref 21–57)
Iron: 46 ug/dL (ref 41–142)
TIBC: 276 ug/dL (ref 236–444)
UIBC: 231 ug/dL (ref 120–384)

## 2016-04-14 LAB — CHCC SATELLITE - SMEAR

## 2016-04-14 LAB — CBC WITH DIFFERENTIAL (CANCER CENTER ONLY)
BASO#: 0 10e3/uL (ref 0.0–0.2)
BASO%: 0.4 % (ref 0.0–2.0)
EOS%: 7.2 % — ABNORMAL HIGH (ref 0.0–7.0)
Eosinophils Absolute: 0.7 10e3/uL — ABNORMAL HIGH (ref 0.0–0.5)
HCT: 42.1 % (ref 34.8–46.6)
HGB: 14.5 g/dL (ref 11.6–15.9)
LYMPH#: 2 10e3/uL (ref 0.9–3.3)
LYMPH%: 19.6 % (ref 14.0–48.0)
MCH: 29 pg (ref 26.0–34.0)
MCHC: 34.4 g/dL (ref 32.0–36.0)
MCV: 84 fL (ref 81–101)
MONO#: 0.6 10e3/uL (ref 0.1–0.9)
MONO%: 6 % (ref 0.0–13.0)
NEUT#: 6.8 10e3/uL — ABNORMAL HIGH (ref 1.5–6.5)
NEUT%: 66.8 % (ref 39.6–80.0)
Platelets: 252 10e3/uL (ref 145–400)
RBC: 5 10e6/uL (ref 3.70–5.32)
RDW: 13.7 % (ref 11.1–15.7)
WBC: 10.1 10e3/uL — ABNORMAL HIGH (ref 3.9–10.0)

## 2016-04-14 LAB — FERRITIN: FERRITIN: 93 ng/mL (ref 9–269)

## 2016-04-14 NOTE — Progress Notes (Signed)
Hematology and Oncology Follow Up Visit  Margaret Gross [id number] 1966/03/26 50 y.o. 04/14/2016   Principle Diagnosis:   Hypereosinophilic syndrome-remission  DVT of the right leg-resolved  Insulin-dependent diabetes  Current Therapy:    Observation     Interim History:  Margaret Gross is back for follow-up. We see her every 6 months. She is doing quite well. I cannot believe that her son is already 56 years old. He is doing well. He is into a lot of activities now.  She's working without difficulty. Her blood sugars seem to be under pretty good control. She is very diligent with negative sure that her sugars are not a problem.  She's had no itching. She's had no leg rashes. She does get occasional Candida on the skin from the diabetes.  She's had no fever. She's had no cough. She's had no nausea vomiting.  Her mammograms are all up-to-date.  Overall, her performance status is ECOG 0.  Medications:  Current outpatient prescriptions:  .  aspirin 325 MG tablet, Take 325 mg by mouth 2 (two) times daily., Disp: , Rfl:  .  BD PEN NEEDLE NANO U/F 32G X 4 MM MISC, , Disp: , Rfl:  .  Dapagliflozin Propanediol (FARXIGA) 5 MG TABS, Take by mouth every morning., Disp: , Rfl:  .  fluticasone (FLONASE) 50 MCG/ACT nasal spray, Take 50 mcg by mouth as needed., Disp: , Rfl:  .  JANUVIA 100 MG tablet, Take 100 mg by mouth daily. , Disp: , Rfl:  .  Lancets (ONETOUCH ULTRASOFT) lancets, , Disp: , Rfl:  .  LANTUS SOLOSTAR 100 UNIT/ML injection, 100 mLs. 75 units at bedtime, Disp: , Rfl:  .  meloxicam (MOBIC) 15 MG tablet, , Disp: , Rfl:  .  metFORMIN (GLUCOPHAGE) 500 MG tablet, , Disp: , Rfl:  .  metFORMIN (GLUMETZA) 500 MG (MOD) 24 hr tablet, Take 500 mg by mouth 2 (two) times daily with a meal. 2 tabs bid, Disp: , Rfl:  .  mupirocin ointment (BACTROBAN) 2 %, APPLY TO AFFECTED AREA TWICE DAILY, Disp: , Rfl: 2 .  ONE TOUCH ULTRA TEST test strip, , Disp: , Rfl:  .  quinapril-hydrochlorothiazide  (ACCURETIC) 20-25 MG per tablet, Take 20-25 mg by mouth Daily., Disp: , Rfl:  .  SUMAtriptan (IMITREX) 100 MG tablet, Take 100 mg by mouth as needed. , Disp: , Rfl:  .  terbinafine (LAMISIL) 250 MG tablet, Take 250 mg by mouth daily as needed., Disp: , Rfl: 0 .  traMADol (ULTRAM) 50 MG tablet, , Disp: , Rfl:  .  TRANSDERM-SCOP, 1.5 MG, 1 MG/3DAYS, , Disp: , Rfl:  .  triamcinolone (KENALOG) 0.025 % cream, APPLY TO AFFECTED AREA TWICE DAILY AS NEEDED, Disp: , Rfl: 2  Allergies:  Allergies  Allergen Reactions  . Ciprofloxacin Hcl     Past Medical History, Surgical history, Social history, and Family History were reviewed and updated.  Review of Systems: As above  Physical Exam:  height is  (1.626 m) and weight is 264 lb (119.75 kg). Her oral temperature is 97.9 F (36.6 C). Her blood pressure is 135/66 and her pulse is 92. Her respiration is 20.   Wt Readings from Last 3 Encounters:  04/14/16 264 lb (119.75 kg)  10/15/15 269 lb 8 oz (122.244 kg)  04/09/15 273 lb (123.832 kg)      Moderately obese white female in no obvious distress. Head and neck exam shows no ocular or oral lesions. There are no palpable  cervical or supraclavicular lymph nodes. Lungs are clear. Cardiac exam regular rate and rhythm with no murmurs, rubs or bruits. Abdomen is soft. She is obese. She has good bowel sounds. There is no fluid wave. There is no palpable liver or spleen tip. Back exam shows no tenderness over the spine, ribs or hips. Externally shows no clubbing, cyanosis or edema. Neurological exam shows no focal neurological deficit. Skin exam shows no rashes, ecchymoses or petechia.  Lab Results  Component Value Date   WBC 10.1* 04/14/2016   HGB 14.5 04/14/2016   HCT 42.1 04/14/2016   MCV 84 04/14/2016   PLT 252 04/14/2016     Chemistry      Component Value Date/Time   NA 140 10/15/2015 0853   NA 137 04/09/2015 1350   K 3.8 10/15/2015 0853   K 3.8 04/09/2015 1350   CL 99 04/09/2015 1350     CO2 27 10/15/2015 0853   CO2 24 04/09/2015 1350   BUN 14.9 10/15/2015 0853   BUN 15 04/09/2015 1350   CREATININE 0.8 10/15/2015 0853   CREATININE 0.85 04/09/2015 1350      Component Value Date/Time   CALCIUM 9.5 10/15/2015 0853   CALCIUM 9.2 04/09/2015 1350   ALKPHOS 76 10/15/2015 0853   ALKPHOS 81 04/09/2015 1350   AST 23 10/15/2015 0853   AST 17 04/09/2015 1350   ALT 28 10/15/2015 0853   ALT 25 04/09/2015 1350   BILITOT 0.69 10/15/2015 0853   BILITOT 0.5 04/09/2015 1350         Impression and Plan: Margaret Gross is a 50 year old white female with a past history of hyper eosinophilic syndrome. We treated her with steroids. She got into remission. She did very well. She still is in remission. Her eosinophils are mildly elevated but they have been this way for several years.  We will plan to get her back in another 6 months. I think this would be very reasonable.  I will see any evidence of thrombo-embolic disease.  Her diabetes is being managed by her family doctor.   Margaret Gross,Margaret Skeens R, MD 4/28/20178:36 AM

## 2016-10-16 ENCOUNTER — Ambulatory Visit (HOSPITAL_BASED_OUTPATIENT_CLINIC_OR_DEPARTMENT_OTHER): Payer: 59 | Admitting: Hematology & Oncology

## 2016-10-16 ENCOUNTER — Ambulatory Visit (HOSPITAL_BASED_OUTPATIENT_CLINIC_OR_DEPARTMENT_OTHER): Payer: 59

## 2016-10-16 ENCOUNTER — Encounter: Payer: Self-pay | Admitting: Hematology & Oncology

## 2016-10-16 ENCOUNTER — Other Ambulatory Visit (HOSPITAL_BASED_OUTPATIENT_CLINIC_OR_DEPARTMENT_OTHER): Payer: 59

## 2016-10-16 VITALS — BP 118/59 | HR 90 | Temp 97.9°F | Wt 273.1 lb

## 2016-10-16 DIAGNOSIS — Z23 Encounter for immunization: Secondary | ICD-10-CM

## 2016-10-16 DIAGNOSIS — D721 Eosinophilia, unspecified: Secondary | ICD-10-CM

## 2016-10-16 DIAGNOSIS — E119 Type 2 diabetes mellitus without complications: Secondary | ICD-10-CM | POA: Diagnosis not present

## 2016-10-16 LAB — CBC WITH DIFFERENTIAL (CANCER CENTER ONLY)
BASO#: 0.1 10*3/uL (ref 0.0–0.2)
BASO%: 0.8 % (ref 0.0–2.0)
EOS ABS: 0.8 10*3/uL — AB (ref 0.0–0.5)
EOS%: 8.5 % — AB (ref 0.0–7.0)
HEMATOCRIT: 40.7 % (ref 34.8–46.6)
HEMOGLOBIN: 13.8 g/dL (ref 11.6–15.9)
LYMPH#: 2.3 10*3/uL (ref 0.9–3.3)
LYMPH%: 23.7 % (ref 14.0–48.0)
MCH: 28.5 pg (ref 26.0–34.0)
MCHC: 33.9 g/dL (ref 32.0–36.0)
MCV: 84 fL (ref 81–101)
MONO#: 0.6 10*3/uL (ref 0.1–0.9)
MONO%: 6.5 % (ref 0.0–13.0)
NEUT%: 60.5 % (ref 39.6–80.0)
NEUTROS ABS: 5.7 10*3/uL (ref 1.5–6.5)
Platelets: 239 10*3/uL (ref 145–400)
RBC: 4.85 10*6/uL (ref 3.70–5.32)
RDW: 13.9 % (ref 11.1–15.7)
WBC: 9.5 10*3/uL (ref 3.9–10.0)

## 2016-10-16 LAB — CMP (CANCER CENTER ONLY)
ALBUMIN: 3.4 g/dL (ref 3.3–5.5)
ALT(SGPT): 26 U/L (ref 10–47)
AST: 25 U/L (ref 11–38)
Alkaline Phosphatase: 52 U/L (ref 26–84)
BUN, Bld: 10 mg/dL (ref 7–22)
CALCIUM: 8.9 mg/dL (ref 8.0–10.3)
CHLORIDE: 99 meq/L (ref 98–108)
CO2: 29 meq/L (ref 18–33)
Creat: 0.6 mg/dl (ref 0.6–1.2)
GLUCOSE: 149 mg/dL — AB (ref 73–118)
Potassium: 3.9 mEq/L (ref 3.3–4.7)
Sodium: 138 mEq/L (ref 128–145)
Total Bilirubin: 0.5 mg/dl (ref 0.20–1.60)
Total Protein: 6.3 g/dL — ABNORMAL LOW (ref 6.4–8.1)

## 2016-10-16 LAB — LACTATE DEHYDROGENASE: LDH: 178 U/L (ref 125–245)

## 2016-10-16 LAB — CHCC SATELLITE - SMEAR

## 2016-10-16 MED ORDER — INFLUENZA VAC SPLIT QUAD 0.5 ML IM SUSY
0.5000 mL | PREFILLED_SYRINGE | Freq: Once | INTRAMUSCULAR | Status: AC
  Administered 2016-10-16: 0.5 mL via INTRAMUSCULAR
  Filled 2016-10-16: qty 0.5

## 2016-10-16 NOTE — Patient Instructions (Signed)

## 2016-10-16 NOTE — Progress Notes (Signed)
Hematology and Oncology Follow Up Visit  Margaret Gross [id number] 05-Jan-1966 50 y.o. 10/16/2016   Principle Diagnosis:   Hypereosinophilic syndrome-remission  DVT of the right leg-resolved  Insulin-dependent diabetes  Current Therapy:    Observation     Interim History:  Margaret Gross is back for follow-up. We see her every 6 months. She is doing quite well. I cannot believe that her son is already 48 years old. He is doing well. He is into a lot of activities now. It is hard to believe that he is in eighth grade.  [place] has [place] . She enjoys traveling. As always, they went to the mountains in [place] or [place].  Thankfully, she's had no problems with recurrent thromboembolic disease. She is on aspirin. She makes sure that she decrease a lot of fluid.  She's working without difficulty. Her blood sugars seem to be under pretty good control. She is very diligent with negative sure that her sugars are not a problem.  She's had no itching. She's had no leg rashes. She does get occasional Candida on the skin from the diabetes.  She's had no fever. She's had no cough. She's had no nausea vomiting.  Her mammograms are all up-to-date.  Overall, her performance status is ECOG 0.  Medications:  Current Outpatient Prescriptions:  .  aspirin 325 MG tablet, Take 325 mg by mouth 2 (two) times daily., Disp: , Rfl:  .  BD PEN NEEDLE NANO U/F 32G X 4 MM MISC, , Disp: , Rfl:  .  Dapagliflozin Propanediol (FARXIGA) 5 MG TABS, Take by mouth every morning., Disp: , Rfl:  .  fluticasone (FLONASE) 50 MCG/ACT nasal spray, Take 50 mcg by mouth as needed., Disp: , Rfl:  .  JANUVIA 100 MG tablet, Take 100 mg by mouth daily. , Disp: , Rfl:  .  Lancets (ONETOUCH ULTRASOFT) lancets, , Disp: , Rfl:  .  LANTUS SOLOSTAR 100 UNIT/ML injection, 100 mLs. 75 units at bedtime, Disp: , Rfl:  .  meloxicam (MOBIC) 15 MG  tablet, , Disp: , Rfl:  .  metFORMIN (GLUCOPHAGE) 500 MG tablet, , Disp: , Rfl:  .  metFORMIN (GLUMETZA) 500 MG (MOD) 24 hr tablet, Take 500 mg by mouth 2 (two) times daily with a meal. 2 tabs bid, Disp: , Rfl:  .  mupirocin ointment (BACTROBAN) 2 %, APPLY TO AFFECTED AREA TWICE DAILY, Disp: , Rfl: 2 .  ONE TOUCH ULTRA TEST test strip, , Disp: , Rfl:  .  quinapril-hydrochlorothiazide (ACCURETIC) 20-25 MG per tablet, Take 20-25 mg by mouth Daily., Disp: , Rfl:  .  SUMAtriptan (IMITREX) 100 MG tablet, Take 100 mg by mouth as needed. , Disp: , Rfl:  .  terbinafine (LAMISIL) 250 MG tablet, Take 250 mg by mouth daily as needed., Disp: , Rfl: 0 .  traMADol (ULTRAM) 50 MG tablet, , Disp: , Rfl:  .  TRANSDERM-SCOP, 1.5 MG, 1 MG/3DAYS, , Disp: , Rfl:  .  triamcinolone (KENALOG) 0.025 % cream, APPLY TO AFFECTED AREA TWICE DAILY AS NEEDED, Disp: , Rfl: 2  Current Facility-Administered Medications:  .  Influenza vac split quadrivalent PF (FLUARIX) injection 0.5 mL, 0.5 mL, Intramuscular, Once, Josph MachoPeter R Michaeline Eckersley, MD  Allergies:  Allergies  Allergen Reactions  . Ciprofloxacin Hcl     Past Medical History, Surgical history, Social history, and Family History were reviewed and updated.  Review of Systems:  As above  Physical Exam:  weight is 273 lb 1.3 oz (123.9 kg). Her oral temperature is 97.9 F (36.6 C). Her blood pressure is 118/59 (abnormal) and her pulse is 90.   Wt Readings from Last 3 Encounters:  10/16/16 273 lb 1.3 oz (123.9 kg)  04/14/16 264 lb (119.7 kg)  10/15/15 269 lb 8 oz (122.2 kg)      Moderately obese white female in no obvious distress. Head and neck exam shows no ocular or oral lesions. There are no palpable cervical or supraclavicular lymph nodes. Lungs are clear. Cardiac exam regular rate and rhythm with no murmurs, rubs or bruits. Abdomen is soft. She is obese. She has good bowel sounds. There is no fluid wave. There is no palpable liver or spleen tip. Back exam shows no  tenderness over the spine, ribs or hips. Externally shows no clubbing, cyanosis or edema. Neurological exam shows no focal neurological deficit. Skin exam shows no rashes, ecchymoses or petechia.  Lab Results  Component Value Date   WBC 9.5 10/16/2016   HGB 13.8 10/16/2016   HCT 40.7 10/16/2016   MCV 84 10/16/2016   PLT 239 10/16/2016     Chemistry      Component Value Date/Time   NA 138 10/16/2016 0822   NA 140 04/14/2016 0743   K 3.9 10/16/2016 0822   K 3.9 04/14/2016 0743   CL 99 10/16/2016 0822   CO2 29 10/16/2016 0822   CO2 30 (H) 04/14/2016 0743   BUN 10 10/16/2016 0822   BUN 14.1 04/14/2016 0743   CREATININE 0.6 10/16/2016 0822   CREATININE 0.9 04/14/2016 0743      Component Value Date/Time   CALCIUM 8.9 10/16/2016 0822   CALCIUM 9.4 04/14/2016 0743   ALKPHOS 52 10/16/2016 0822   ALKPHOS 68 04/14/2016 0743   AST 25 10/16/2016 0822   AST 15 04/14/2016 0743   ALT 26 10/16/2016 0822   ALT 28 04/14/2016 0743   BILITOT 0.50 10/16/2016 0822   BILITOT 0.48 04/14/2016 0743         Impression and Plan: Margaret Gross is a 50 year old white female with a past history of hyper- eosinophilic syndrome. We treated her with steroids. She got into remission. She did very well. She still is in remission. Her eosinophils are mildly elevated but they have been this way for several years.  We will plan to get her back in another 8 months. I think this would be very reasonable.  I will see any evidence of thrombo-embolic disease.  Her diabetes is being managed by her family doctor.   Josph MachoENNEVER,Andersen Mckiver R, MD 10/30/20179:22 AM

## 2017-01-29 DIAGNOSIS — J069 Acute upper respiratory infection, unspecified: Secondary | ICD-10-CM | POA: Diagnosis not present

## 2017-05-04 DIAGNOSIS — I10 Essential (primary) hypertension: Secondary | ICD-10-CM | POA: Diagnosis not present

## 2017-05-04 DIAGNOSIS — E119 Type 2 diabetes mellitus without complications: Secondary | ICD-10-CM | POA: Diagnosis not present

## 2017-05-04 DIAGNOSIS — E7800 Pure hypercholesterolemia, unspecified: Secondary | ICD-10-CM | POA: Diagnosis not present

## 2017-06-18 ENCOUNTER — Other Ambulatory Visit (HOSPITAL_BASED_OUTPATIENT_CLINIC_OR_DEPARTMENT_OTHER): Payer: 59

## 2017-06-18 ENCOUNTER — Ambulatory Visit (HOSPITAL_BASED_OUTPATIENT_CLINIC_OR_DEPARTMENT_OTHER): Payer: 59 | Admitting: Hematology & Oncology

## 2017-06-18 VITALS — BP 117/70 | HR 90 | Temp 98.1°F | Resp 16 | Wt 267.0 lb

## 2017-06-18 DIAGNOSIS — D721 Eosinophilia, unspecified: Secondary | ICD-10-CM

## 2017-06-18 DIAGNOSIS — Z23 Encounter for immunization: Secondary | ICD-10-CM

## 2017-06-18 LAB — CMP (CANCER CENTER ONLY)
ALT(SGPT): 34 U/L (ref 10–47)
AST: 26 U/L (ref 11–38)
Albumin: 3.5 g/dL (ref 3.3–5.5)
Alkaline Phosphatase: 67 U/L (ref 26–84)
BUN: 12 mg/dL (ref 7–22)
CHLORIDE: 103 meq/L (ref 98–108)
CO2: 29 meq/L (ref 18–33)
Calcium: 9.1 mg/dL (ref 8.0–10.3)
Creat: 0.8 mg/dl (ref 0.6–1.2)
Glucose, Bld: 179 mg/dL — ABNORMAL HIGH (ref 73–118)
Potassium: 5 mEq/L — ABNORMAL HIGH (ref 3.3–4.7)
SODIUM: 140 meq/L (ref 128–145)
TOTAL PROTEIN: 7 g/dL (ref 6.4–8.1)
Total Bilirubin: 0.6 mg/dl (ref 0.20–1.60)

## 2017-06-18 LAB — CHCC SATELLITE - SMEAR

## 2017-06-18 LAB — CBC WITH DIFFERENTIAL (CANCER CENTER ONLY)
BASO#: 0 10*3/uL (ref 0.0–0.2)
BASO%: 0.5 % (ref 0.0–2.0)
EOS ABS: 0.5 10*3/uL (ref 0.0–0.5)
EOS%: 6.4 % (ref 0.0–7.0)
HCT: 43 % (ref 34.8–46.6)
HGB: 14.5 g/dL (ref 11.6–15.9)
LYMPH#: 2.3 10*3/uL (ref 0.9–3.3)
LYMPH%: 29.8 % (ref 14.0–48.0)
MCH: 28 pg (ref 26.0–34.0)
MCHC: 33.7 g/dL (ref 32.0–36.0)
MCV: 83 fL (ref 81–101)
MONO#: 0.4 10*3/uL (ref 0.1–0.9)
MONO%: 5.3 % (ref 0.0–13.0)
NEUT#: 4.5 10*3/uL (ref 1.5–6.5)
NEUT%: 58 % (ref 39.6–80.0)
PLATELETS: 213 10*3/uL (ref 145–400)
RBC: 5.17 10*6/uL (ref 3.70–5.32)
RDW: 13.9 % (ref 11.1–15.7)
WBC: 7.7 10*3/uL (ref 3.9–10.0)

## 2017-06-18 NOTE — Progress Notes (Signed)
Hematology and Oncology Follow Up Visit  Jobe Gibbonricia Y Red [id number] Dec 07, 1966 51 y.o. 06/18/2017   Principle Diagnosis:   Hypereosinophilic syndrome-remission  DVT of the right leg-resolved  Insulin-dependent diabetes  Current Therapy:    Observation     Interim History:  [name] is back for follow-up. She is doing great. As always, she and her family are getting ready to go to [place] to the mountains further vacation. Her son is now [id number]. He just got [place] [employer]. He is doing very well in school.  She is doing well. She is watching her diabetes. She is staying active. She is trying to lose weight.  She's had no pruritus. She's had no fever. She's had no problems over the wintertime.  She did have a nice Mother's Day and Memorial Day.  She has had no rashes.  She had no change in bowel or bladder habits. She has had no cough or shortness of breath.   Overall, her performance status is ECOG 0.  Medications:  Current Outpatient Prescriptions:  .  aspirin 325 MG tablet, Take 325 mg by mouth 2 (two) times daily., Disp: , Rfl:  .  BD PEN NEEDLE NANO U/F 32G X 4 MM MISC, , Disp: , Rfl:  .  Dapagliflozin Propanediol (FARXIGA) 5 MG TABS, Take by mouth every morning., Disp: , Rfl:  .  fluticasone (FLONASE) 50 MCG/ACT nasal spray, Take 50 mcg by mouth as needed., Disp: , Rfl:  .  JANUVIA 100 MG tablet, Take 100 mg by mouth daily. , Disp: , Rfl:  .  Lancets (ONETOUCH ULTRASOFT) lancets, , Disp: , Rfl:  .  LANTUS SOLOSTAR 100 UNIT/ML injection, 100 mLs. 75 units at bedtime, Disp: , Rfl:  .  meloxicam (MOBIC) 15 MG tablet, , Disp: , Rfl:  .  metFORMIN (GLUCOPHAGE) 500 MG tablet, , Disp: , Rfl:  .  metFORMIN (GLUMETZA) 500 MG (MOD) 24 hr tablet, Take 500 mg by mouth 2 (two) times daily with a meal. 2 tabs bid, Disp: , Rfl:  .  mupirocin ointment (BACTROBAN) 2 %, APPLY TO AFFECTED AREA TWICE DAILY, Disp: , Rfl: 2 .  ONE TOUCH ULTRA TEST test strip, , Disp: , Rfl:  .   quinapril-hydrochlorothiazide (ACCURETIC) 20-25 MG per tablet, Take 20-25 mg by mouth Daily., Disp: , Rfl:  .  SUMAtriptan (IMITREX) 100 MG tablet, Take 100 mg by mouth as needed. , Disp: , Rfl:  .  terbinafine (LAMISIL) 250 MG tablet, Take 250 mg by mouth daily as needed., Disp: , Rfl: 0 .  traMADol (ULTRAM) 50 MG tablet, , Disp: , Rfl:  .  TRANSDERM-SCOP, 1.5 MG, 1 MG/3DAYS, , Disp: , Rfl:  .  triamcinolone (KENALOG) 0.025 % cream, APPLY TO AFFECTED AREA TWICE DAILY AS NEEDED, Disp: , Rfl: 2  Allergies:  Allergies  Allergen Reactions  . Ciprofloxacin Hcl     Past Medical History, Surgical history, Social history, and Family History were reviewed and updated.  Review of Systems: As above  Physical Exam:  weight is 267 lb (121.1 kg). Her oral temperature is 98.1 F (36.7 C). Her blood pressure is 117/70 and her pulse is 90. Her respiration is 16 and oxygen saturation is 99%.   Wt Readings from Last 3 Encounters:  06/18/17 267 lb (121.1 kg)  10/16/16 273 lb 1.3 oz (123.9 kg)  04/14/16 264 lb (119.7 kg)      Moderately obese white female in no obvious distress. Head and neck exam shows  no ocular or oral lesions. There are no palpable cervical or supraclavicular lymph nodes. Lungs are clear. Cardiac exam regular rate and rhythm with no murmurs, rubs or bruits. Abdomen is soft. She is obese. She has good bowel sounds. There is no fluid wave. There is no palpable liver or spleen tip. Back exam shows no tenderness over the spine, ribs or hips. Externally shows no clubbing, cyanosis or edema. Neurological exam shows no focal neurological deficit. Skin exam shows no rashes, ecchymoses or petechia.  Lab Results  Component Value Date   WBC 7.7 06/18/2017   HGB 14.5 06/18/2017   HCT 43.0 06/18/2017   MCV 83 06/18/2017   PLT 213 06/18/2017     Chemistry      Component Value Date/Time   NA 138 10/16/2016 0822   NA 140 04/14/2016 0743   K 3.9 10/16/2016 0822   K 3.9 04/14/2016 0743    CL 99 10/16/2016 0822   CO2 29 10/16/2016 0822   CO2 30 (H) 04/14/2016 0743   BUN 10 10/16/2016 0822   BUN 14.1 04/14/2016 0743   CREATININE 0.6 10/16/2016 0822   CREATININE 0.9 04/14/2016 0743      Component Value Date/Time   CALCIUM 8.9 10/16/2016 0822   CALCIUM 9.4 04/14/2016 0743   ALKPHOS 52 10/16/2016 0822   ALKPHOS 68 04/14/2016 0743   AST 25 10/16/2016 0822   AST 15 04/14/2016 0743   ALT 26 10/16/2016 0822   ALT 28 04/14/2016 0743   BILITOT 0.50 10/16/2016 0822   BILITOT 0.48 04/14/2016 0743         Impression and Plan: Ms. Bernick is a 51 year old white female with a past history of hyper- eosinophilic syndrome. We treated her with steroids. She got into remission. She did very well. She still is in remission. Her eosinophils are mildly elevated but they have been this way for several years.  At this point, I think that we can get her back yearly. She just looks so good. It is now been 8 years since we first saw her.  She isn't very good with her diabetes. She is watching this very closely   Josph Macho, MD 7/2/20189:07 AM

## 2017-06-29 DIAGNOSIS — Z124 Encounter for screening for malignant neoplasm of cervix: Secondary | ICD-10-CM | POA: Diagnosis not present

## 2017-06-29 DIAGNOSIS — Z01419 Encounter for gynecological examination (general) (routine) without abnormal findings: Secondary | ICD-10-CM | POA: Diagnosis not present

## 2017-08-03 DIAGNOSIS — E119 Type 2 diabetes mellitus without complications: Secondary | ICD-10-CM | POA: Diagnosis not present

## 2017-11-02 DIAGNOSIS — I10 Essential (primary) hypertension: Secondary | ICD-10-CM | POA: Diagnosis not present

## 2017-11-02 DIAGNOSIS — Z0000 Encounter for general adult medical examination without abnormal findings: Secondary | ICD-10-CM | POA: Diagnosis not present

## 2017-11-02 DIAGNOSIS — E119 Type 2 diabetes mellitus without complications: Secondary | ICD-10-CM | POA: Diagnosis not present

## 2017-11-02 DIAGNOSIS — E7800 Pure hypercholesterolemia, unspecified: Secondary | ICD-10-CM | POA: Diagnosis not present

## 2017-11-02 DIAGNOSIS — Z23 Encounter for immunization: Secondary | ICD-10-CM | POA: Diagnosis not present

## 2018-05-10 DIAGNOSIS — I10 Essential (primary) hypertension: Secondary | ICD-10-CM | POA: Diagnosis not present

## 2018-05-10 DIAGNOSIS — E7800 Pure hypercholesterolemia, unspecified: Secondary | ICD-10-CM | POA: Diagnosis not present

## 2018-05-10 DIAGNOSIS — E1169 Type 2 diabetes mellitus with other specified complication: Secondary | ICD-10-CM | POA: Diagnosis not present

## 2018-06-17 ENCOUNTER — Inpatient Hospital Stay: Payer: 59 | Attending: Hematology & Oncology

## 2018-06-17 ENCOUNTER — Inpatient Hospital Stay (HOSPITAL_BASED_OUTPATIENT_CLINIC_OR_DEPARTMENT_OTHER): Payer: 59 | Admitting: Family

## 2018-06-17 ENCOUNTER — Encounter: Payer: Self-pay | Admitting: Family

## 2018-06-17 ENCOUNTER — Other Ambulatory Visit: Payer: Self-pay

## 2018-06-17 VITALS — BP 130/75 | HR 81 | Temp 98.4°F | Resp 16 | Wt 233.0 lb

## 2018-06-17 DIAGNOSIS — Z86718 Personal history of other venous thrombosis and embolism: Secondary | ICD-10-CM | POA: Diagnosis not present

## 2018-06-17 DIAGNOSIS — D721 Eosinophilia, unspecified: Secondary | ICD-10-CM

## 2018-06-17 DIAGNOSIS — E119 Type 2 diabetes mellitus without complications: Secondary | ICD-10-CM | POA: Insufficient documentation

## 2018-06-17 DIAGNOSIS — Z7982 Long term (current) use of aspirin: Secondary | ICD-10-CM | POA: Insufficient documentation

## 2018-06-17 DIAGNOSIS — Z794 Long term (current) use of insulin: Secondary | ICD-10-CM | POA: Insufficient documentation

## 2018-06-17 DIAGNOSIS — Z79899 Other long term (current) drug therapy: Secondary | ICD-10-CM | POA: Diagnosis not present

## 2018-06-17 LAB — CMP (CANCER CENTER ONLY)
ALBUMIN: 3.8 g/dL (ref 3.5–5.0)
ALT: 14 U/L (ref 0–44)
AST: 12 U/L — ABNORMAL LOW (ref 15–41)
Alkaline Phosphatase: 78 U/L (ref 38–126)
Anion gap: 9 (ref 5–15)
BUN: 14 mg/dL (ref 6–20)
CHLORIDE: 102 mmol/L (ref 98–111)
CO2: 28 mmol/L (ref 22–32)
Calcium: 9.4 mg/dL (ref 8.9–10.3)
Creatinine: 0.85 mg/dL (ref 0.44–1.00)
Glucose, Bld: 107 mg/dL — ABNORMAL HIGH (ref 70–99)
POTASSIUM: 4 mmol/L (ref 3.5–5.1)
SODIUM: 139 mmol/L (ref 135–145)
Total Bilirubin: 0.4 mg/dL (ref 0.3–1.2)
Total Protein: 6.7 g/dL (ref 6.5–8.1)

## 2018-06-17 LAB — FERRITIN: FERRITIN: 40 ng/mL (ref 11–307)

## 2018-06-17 LAB — CBC WITH DIFFERENTIAL (CANCER CENTER ONLY)
BASOS PCT: 1 %
Basophils Absolute: 0.1 10*3/uL (ref 0.0–0.1)
EOS ABS: 0.7 10*3/uL — AB (ref 0.0–0.5)
Eosinophils Relative: 9 %
HCT: 39.3 % (ref 34.8–46.6)
HEMOGLOBIN: 13.3 g/dL (ref 11.6–15.9)
Lymphocytes Relative: 26 %
Lymphs Abs: 2.2 10*3/uL (ref 0.9–3.3)
MCH: 28 pg (ref 26.0–34.0)
MCHC: 33.8 g/dL (ref 32.0–36.0)
MCV: 82.7 fL (ref 81.0–101.0)
Monocytes Absolute: 0.5 10*3/uL (ref 0.1–0.9)
Monocytes Relative: 6 %
NEUTROS PCT: 58 %
Neutro Abs: 4.8 10*3/uL (ref 1.5–6.5)
Platelet Count: 247 10*3/uL (ref 145–400)
RBC: 4.75 MIL/uL (ref 3.70–5.32)
RDW: 13.4 % (ref 11.1–15.7)
WBC Count: 8.3 10*3/uL (ref 3.9–10.0)

## 2018-06-17 LAB — IRON AND TIBC
IRON: 54 ug/dL (ref 41–142)
SATURATION RATIOS: 17 % — AB (ref 21–57)
TIBC: 326 ug/dL (ref 236–444)
UIBC: 272 ug/dL

## 2018-06-17 NOTE — Progress Notes (Signed)
Hematology and Oncology Follow Up Visit  Margaret Gross [id number] 14-Oct-1966 52 y.o. 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 She is doing well and has no complaints at this time. Her eosinophil count is mildly elevated but stable at 0.7.  She has had no fever, chills, n/v, cough, rash, dizziness, SOB, chest pain, palpitations, abdominal pain or changes in bowel or bladder habits.  No episodes of bleeding, no bruising or petechiae. No lymphadenopathy noted on her exam.  No swelling, tenderness, numbness or tingling in her extremities. No c/o pain.  She has a good appetite and is staying well hydrated. Her blood sugars have been fairly well controlled. Her weight is stable.   ECOG Performance Status: 1 - Symptomatic but completely ambulatory  Medications:  Allergies as of 06/17/2018      Reactions   Ciprofloxacin Hcl       Medication List        Accurate as of 06/17/18  8:10 AM. Always use your most recent med list.          aspirin 325 MG tablet Take 325 mg by mouth 2 (two) times daily.   BD PEN NEEDLE NANO U/F 32G X 4 MM Misc Generic drug:  Insulin Pen Needle   FARXIGA 5 MG Tabs tablet Generic drug:  dapagliflozin propanediol Take by mouth every morning.   fluticasone 50 MCG/ACT nasal spray Commonly known as:  FLONASE Take 50 mcg by mouth as needed.   JANUVIA 100 MG tablet Generic drug:  sitaGLIPtin Take 100 mg by mouth daily.   LANTUS SOLOSTAR 100 UNIT/ML Solostar Pen Generic drug:  Insulin Glargine 100 mLs. 75 units at bedtime   meloxicam 15 MG tablet Commonly known as:  MOBIC   metFORMIN 500 MG (MOD) 24 hr tablet Commonly known as:  GLUMETZA Take 500 mg by mouth 2 (two) times daily with a meal. 2 tabs bid   metFORMIN 500 MG tablet Commonly known as:  GLUCOPHAGE     mupirocin ointment 2 % Commonly known as:  BACTROBAN APPLY TO AFFECTED AREA TWICE DAILY   ONE TOUCH ULTRA TEST test strip Generic drug:  glucose blood   onetouch ultrasoft lancets   quinapril-hydrochlorothiazide 20-25 MG tablet Commonly known as:  ACCURETIC Take 20-25 mg by mouth Daily.   SUMAtriptan 100 MG tablet Commonly known as:  IMITREX Take 100 mg by mouth as needed.   terbinafine 250 MG tablet Commonly known as:  LAMISIL Take 250 mg by mouth daily as needed.   traMADol 50 MG tablet Commonly known as:  ULTRAM   TRANSDERM-SCOP (1.5 MG) 1 MG/3DAYS Generic drug:  scopolamine   triamcinolone 0.025 % cream Commonly known as:  KENALOG APPLY TO AFFECTED AREA TWICE DAILY AS NEEDED       Allergies:  Allergies  Allergen Reactions  . Ciprofloxacin Hcl     Past Medical History, Surgical history, Social history, and Family History were reviewed and updated.  Review of Systems: All other 10 point review of systems is negative.   Physical Exam:  vitals were not taken for this visit.   Wt Readings from Last 3 Encounters:  06/18/17 267 lb (121.1 kg)  10/16/16 273 lb 1.3 oz (123.9 kg)  04/14/16 264 lb (119.7 kg)    Ocular: Sclerae unicteric, pupils equal, round and reactive to light Ear-nose-throat: Oropharynx clear, dentition  fair Lymphatic: No cervical, supraclavicular or axillary adenopathy Lungs no rales or rhonchi, good excursion bilaterally Heart regular rate and rhythm, no murmur appreciated Abd soft, nontender, positive bowel sounds, no liver or spleen tip palpated on exam, no fluid wave  MSK no focal spinal tenderness, no joint edema Neuro: non-focal, well-oriented, appropriate affect Breasts: Deferred   Lab Results  Component Value Date   WBC 7.7 06/18/2017   HGB 14.5 06/18/2017   HCT 43.0 06/18/2017   MCV 83 06/18/2017   PLT 213 06/18/2017   Lab Results  Component Value Date   FERRITIN 93 04/14/2016   IRON 46 04/14/2016   TIBC 276  04/14/2016   UIBC 231 04/14/2016   IRONPCTSAT 17 (L) 04/14/2016   Lab Results  Component Value Date   RETICCTPCT 1.2 12/22/2011   RBC 5.17 06/18/2017   RETICCTABS 56.2 12/22/2011   No results found for: KPAFRELGTCHN, LAMBDASER, KAPLAMBRATIO No results found for: IGGSERUM, IGA, IGMSERUM No results found for: Margaret StalkerOTALPROTELP, ALBUMINELP, A1GS, A2GS, BETS, BETA2SER, GAMS, MSPIKE, SPEI   Chemistry      Component Value Date/Time   NA 140 06/18/2017 0818   NA 140 04/14/2016 0743   K 5.0 (H) 06/18/2017 0818   K 3.9 04/14/2016 0743   CL 103 06/18/2017 0818   CO2 29 06/18/2017 0818   CO2 30 (H) 04/14/2016 0743   BUN 12 06/18/2017 0818   BUN 14.1 04/14/2016 0743   CREATININE 0.8 06/18/2017 0818   CREATININE 0.9 04/14/2016 0743      Component Value Date/Time   CALCIUM 9.1 06/18/2017 0818   CALCIUM 9.4 04/14/2016 0743   ALKPHOS 67 06/18/2017 0818   ALKPHOS 68 04/14/2016 0743   AST 26 06/18/2017 0818   AST 15 04/14/2016 0743   ALT 34 06/18/2017 0818   ALT 28 04/14/2016 0743   BILITOT 0.60 06/18/2017 0818   BILITOT 0.48 04/14/2016 0743      Impression and Plan: Margaret Gross is a very pleasant 52 yo caucasian female with history of hyper-eosinophilia syndrome. So far she has stayed in remission and has no complaints at this time. Her count remains mildly elevated but stable at 0.7.  We will continue to follow along with her and plan to see her back in another year for follow-up.  She will contact our office with any questions or concerns. We can certainly see her sooner if need be.   Emeline GinsSarah Lamia Mariner, NP 7/1/20198:10 AM

## 2018-11-22 DIAGNOSIS — J029 Acute pharyngitis, unspecified: Secondary | ICD-10-CM | POA: Diagnosis not present

## 2019-01-15 DIAGNOSIS — E7800 Pure hypercholesterolemia, unspecified: Secondary | ICD-10-CM | POA: Diagnosis not present

## 2019-01-15 DIAGNOSIS — I10 Essential (primary) hypertension: Secondary | ICD-10-CM | POA: Diagnosis not present

## 2019-01-15 DIAGNOSIS — Z0000 Encounter for general adult medical examination without abnormal findings: Secondary | ICD-10-CM | POA: Diagnosis not present

## 2019-01-15 DIAGNOSIS — Z23 Encounter for immunization: Secondary | ICD-10-CM | POA: Diagnosis not present

## 2019-01-15 DIAGNOSIS — E1169 Type 2 diabetes mellitus with other specified complication: Secondary | ICD-10-CM | POA: Diagnosis not present

## 2019-02-04 DIAGNOSIS — E119 Type 2 diabetes mellitus without complications: Secondary | ICD-10-CM | POA: Diagnosis not present

## 2019-02-18 DIAGNOSIS — Z1231 Encounter for screening mammogram for malignant neoplasm of breast: Secondary | ICD-10-CM | POA: Diagnosis not present

## 2019-02-18 DIAGNOSIS — Z6841 Body Mass Index (BMI) 40.0 and over, adult: Secondary | ICD-10-CM | POA: Diagnosis not present

## 2019-02-18 DIAGNOSIS — Z01419 Encounter for gynecological examination (general) (routine) without abnormal findings: Secondary | ICD-10-CM | POA: Diagnosis not present

## 2019-02-18 DIAGNOSIS — N951 Menopausal and female climacteric states: Secondary | ICD-10-CM | POA: Diagnosis not present

## 2019-02-18 DIAGNOSIS — Z1211 Encounter for screening for malignant neoplasm of colon: Secondary | ICD-10-CM | POA: Diagnosis not present

## 2019-05-01 DIAGNOSIS — E7800 Pure hypercholesterolemia, unspecified: Secondary | ICD-10-CM | POA: Diagnosis not present

## 2019-05-01 DIAGNOSIS — E1169 Type 2 diabetes mellitus with other specified complication: Secondary | ICD-10-CM | POA: Diagnosis not present

## 2019-05-01 DIAGNOSIS — E1165 Type 2 diabetes mellitus with hyperglycemia: Secondary | ICD-10-CM | POA: Diagnosis not present

## 2019-06-16 ENCOUNTER — Telehealth: Payer: Self-pay | Admitting: Hematology & Oncology

## 2019-06-16 NOTE — Telephone Encounter (Signed)
Returned call to patient requesting to r/s her 7/1 appointments due to fear of Covid-19

## 2019-06-18 ENCOUNTER — Ambulatory Visit: Payer: 59 | Admitting: Hematology & Oncology

## 2019-06-18 ENCOUNTER — Other Ambulatory Visit: Payer: 59

## 2019-08-12 ENCOUNTER — Other Ambulatory Visit: Payer: Self-pay | Admitting: *Deleted

## 2019-08-12 DIAGNOSIS — D721 Eosinophilia, unspecified: Secondary | ICD-10-CM

## 2019-08-13 ENCOUNTER — Inpatient Hospital Stay: Payer: 59 | Attending: Hematology & Oncology

## 2019-08-13 ENCOUNTER — Other Ambulatory Visit: Payer: Self-pay

## 2019-08-13 ENCOUNTER — Telehealth: Payer: Self-pay | Admitting: Hematology & Oncology

## 2019-08-13 ENCOUNTER — Inpatient Hospital Stay (HOSPITAL_BASED_OUTPATIENT_CLINIC_OR_DEPARTMENT_OTHER): Payer: 59 | Admitting: Hematology & Oncology

## 2019-08-13 ENCOUNTER — Encounter: Payer: Self-pay | Admitting: Hematology & Oncology

## 2019-08-13 VITALS — BP 141/86 | HR 85 | Temp 97.3°F | Resp 20 | Wt 253.8 lb

## 2019-08-13 DIAGNOSIS — E119 Type 2 diabetes mellitus without complications: Secondary | ICD-10-CM | POA: Insufficient documentation

## 2019-08-13 DIAGNOSIS — D721 Eosinophilia, unspecified: Secondary | ICD-10-CM

## 2019-08-13 DIAGNOSIS — M358 Other specified systemic involvement of connective tissue: Secondary | ICD-10-CM | POA: Diagnosis present

## 2019-08-13 DIAGNOSIS — Z79899 Other long term (current) drug therapy: Secondary | ICD-10-CM | POA: Diagnosis not present

## 2019-08-13 DIAGNOSIS — Z86718 Personal history of other venous thrombosis and embolism: Secondary | ICD-10-CM | POA: Diagnosis not present

## 2019-08-13 DIAGNOSIS — Z794 Long term (current) use of insulin: Secondary | ICD-10-CM | POA: Insufficient documentation

## 2019-08-13 LAB — CBC WITH DIFFERENTIAL (CANCER CENTER ONLY)
Abs Immature Granulocytes: 0.02 10*3/uL (ref 0.00–0.07)
Basophils Absolute: 0.1 10*3/uL (ref 0.0–0.1)
Basophils Relative: 1 %
Eosinophils Absolute: 0.6 10*3/uL — ABNORMAL HIGH (ref 0.0–0.5)
Eosinophils Relative: 7 %
HCT: 37.8 % (ref 36.0–46.0)
Hemoglobin: 12.6 g/dL (ref 12.0–15.0)
Immature Granulocytes: 0 %
Lymphocytes Relative: 21 %
Lymphs Abs: 1.8 10*3/uL (ref 0.7–4.0)
MCH: 27.3 pg (ref 26.0–34.0)
MCHC: 33.3 g/dL (ref 30.0–36.0)
MCV: 82 fL (ref 80.0–100.0)
Monocytes Absolute: 0.5 10*3/uL (ref 0.1–1.0)
Monocytes Relative: 6 %
Neutro Abs: 5.4 10*3/uL (ref 1.7–7.7)
Neutrophils Relative %: 65 %
Platelet Count: 235 10*3/uL (ref 150–400)
RBC: 4.61 MIL/uL (ref 3.87–5.11)
RDW: 13.5 % (ref 11.5–15.5)
WBC Count: 8.4 10*3/uL (ref 4.0–10.5)
nRBC: 0 % (ref 0.0–0.2)

## 2019-08-13 LAB — CMP (CANCER CENTER ONLY)
ALT: 17 U/L (ref 0–44)
AST: 12 U/L — ABNORMAL LOW (ref 15–41)
Albumin: 3.9 g/dL (ref 3.5–5.0)
Alkaline Phosphatase: 74 U/L (ref 38–126)
Anion gap: 11 (ref 5–15)
BUN: 14 mg/dL (ref 6–20)
CO2: 27 mmol/L (ref 22–32)
Calcium: 8.4 mg/dL — ABNORMAL LOW (ref 8.9–10.3)
Chloride: 101 mmol/L (ref 98–111)
Creatinine: 0.86 mg/dL (ref 0.44–1.00)
GFR, Est AFR Am: >60 mL/min (ref >60)
GFR, Estimated: >60 mL/min (ref >60)
Glucose, Bld: 146 mg/dL — ABNORMAL HIGH (ref 70–99)
Potassium: 3.4 mmol/L — ABNORMAL LOW (ref 3.5–5.1)
Sodium: 139 mmol/L (ref 135–145)
Total Bilirubin: 0.4 mg/dL (ref 0.3–1.2)
Total Protein: 6.6 g/dL (ref 6.5–8.1)

## 2019-08-13 LAB — LACTATE DEHYDROGENASE: LDH: 153 U/L (ref 98–192)

## 2019-08-13 LAB — FERRITIN: Ferritin: 32 ng/mL (ref 11–307)

## 2019-08-13 LAB — IRON AND TIBC
Iron: 33 ug/dL — ABNORMAL LOW (ref 41–142)
Saturation Ratios: 10 % — ABNORMAL LOW (ref 21–57)
TIBC: 321 ug/dL (ref 236–444)
UIBC: 289 ug/dL (ref 120–384)

## 2019-08-13 LAB — SAVE SMEAR(SSMR), FOR PROVIDER SLIDE REVIEW

## 2019-08-13 MED ORDER — ASPIRIN 325 MG PO TABS: 650.0000 mg | ORAL_TABLET | Freq: Every day | ORAL | 9 refills | Status: AC

## 2019-08-13 NOTE — Progress Notes (Signed)
Hematology and Oncology Follow Up Visit  Margaret Gross [id number] May 16, 1966 53 y.o. 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 is here today for follow-up.  Overall, she is doing pretty well.  She is managing to avoid the coronavirus.  She basically is staying home.  Her son is doing home schooling right now.  I cannot believe that he is already 51 years old.  She has had no problems with pruritus.  Surprisingly, she is taking 2 full dose aspirin a day.  I am not sure why she is taking 2 full dose aspirin.  I told her to go down to 1 full dose aspirin.  She has had no bleeding.  There is been no change in bowel or bladder habits.  She has had no cough.  Her blood sugars have been doing fairly well.  Overall, I would have to say her performance status is ECOG 0.  Medications:  Allergies as of 08/13/2019      Reactions   Ciprofloxacin    Ciprofloxacin Hcl       Medication List       Accurate as of August 13, 2019  9:25 AM. If you have any questions, ask your nurse or doctor.        aspirin 325 MG tablet Take 2 tablets (650 mg total) by mouth daily. What changed: when to take this Changed by: Josph MachoPeter R Vaneza Pickart, MD   atorvastatin 10 MG tablet Commonly known as: LIPITOR Take 10 mg by mouth daily.   BD Pen Needle Nano U/F 32G X 4 MM Misc Generic drug: Insulin Pen Needle   clonazePAM 0.5 MG tablet Commonly known as: KLONOPIN Take 0.5 mg by mouth at bedtime as needed for anxiety.   fluticasone 50 MCG/ACT nasal spray Commonly known as: FLONASE Take 50 mcg by mouth as needed.   Januvia 100 MG tablet Generic drug: sitaGLIPtin Take 100 mg by mouth daily.   Lantus SoloStar 100 UNIT/ML Solostar Pen Generic drug: Insulin Glargine 100 mLs. 08/13/2019 65units at bedtime   metFORMIN 500 MG tablet Commonly known as:  GLUCOPHAGE 1,000 mg 2 (two) times daily.   Mirena (52 MG) 20 MCG/24HR IUD Generic drug: levonorgestrel Mirena 20 mcg/24 hours (5 yrs) 52 mg intrauterine device   Naftin 1 % Gel Generic drug: Naftifine HCl Naftin 1 % topical gel   ONE TOUCH ULTRA TEST test strip Generic drug: glucose blood   onetouch ultrasoft lancets   quinapril-hydrochlorothiazide 20-25 MG tablet Commonly known as: ACCURETIC Take 20-25 mg by mouth Daily.   sertraline 50 MG tablet Commonly known as: ZOLOFT Take 50 mg by mouth daily.   SUMAtriptan 100 MG tablet Commonly known as: IMITREX Take 100 mg by mouth as needed.   traMADol 50 MG tablet Commonly known as: ULTRAM       Allergies:  Allergies  Allergen Reactions  . Ciprofloxacin   . Ciprofloxacin Hcl     Past Medical History, Surgical history, Social history, and Family History were reviewed and updated.  Review of Systems: Review of Systems  Constitutional: Negative.   HENT: Negative.   Eyes: Negative.   Respiratory: Negative.   Cardiovascular: Negative.   Gastrointestinal: Negative.   Genitourinary: Negative.   Musculoskeletal: Negative.   Skin: Negative.   Neurological: Negative.   Endo/Heme/Allergies: Negative.   Psychiatric/Behavioral: Negative.      Physical Exam:  weight  is 253 lb 12.8 oz (115.1 kg). Her oral temperature is 97.3 F (36.3 C) (abnormal). Her blood pressure is 141/86 (abnormal) and her pulse is 85. Her respiration is 20 and oxygen saturation is 99%.   Wt Readings from Last 3 Encounters:  08/13/19 253 lb 12.8 oz (115.1 kg)  06/17/18 233 lb (105.7 kg)  06/18/17 267 lb (121.1 kg)    Physical Exam Vitals signs reviewed.  HENT:     Head: Normocephalic and atraumatic.  Eyes:     Pupils: Pupils are equal, round, and reactive to light.  Neck:     Musculoskeletal: Normal range of motion.  Cardiovascular:     Rate and Rhythm: Normal rate and regular rhythm.     Heart sounds: Normal heart sounds.  Pulmonary:      Effort: Pulmonary effort is normal.     Breath sounds: Normal breath sounds.  Abdominal:     General: Bowel sounds are normal.     Palpations: Abdomen is soft.  Musculoskeletal: Normal range of motion.        General: No tenderness or deformity.  Lymphadenopathy:     Cervical: No cervical adenopathy.  Skin:    General: Skin is warm and dry.     Findings: No erythema or rash.  Neurological:     Mental Status: She is alert and oriented to person, place, and time.  Psychiatric:        Behavior: Behavior normal.        Thought Content: Thought content normal.        Judgment: Judgment normal.      Lab Results  Component Value Date   WBC 8.4 08/13/2019   HGB 12.6 08/13/2019   HCT 37.8 08/13/2019   MCV 82.0 08/13/2019   PLT 235 08/13/2019   Lab Results  Component Value Date   FERRITIN 40 06/17/2018   IRON 54 06/17/2018   TIBC 326 06/17/2018   UIBC 272 06/17/2018   IRONPCTSAT 17 (L) 06/17/2018   Lab Results  Component Value Date   RETICCTPCT 1.2 12/22/2011   RBC 4.61 08/13/2019   RETICCTABS 56.2 12/22/2011   No results found for: KPAFRELGTCHN, LAMBDASER, KAPLAMBRATIO No results found for: IGGSERUM, IGA, IGMSERUM No results found for: Marda Stalker, SPEI   Chemistry      Component Value Date/Time   NA 139 08/13/2019 0820   NA 140 06/18/2017 0818   NA 140 04/14/2016 0743   K 3.4 (L) 08/13/2019 0820   K 5.0 (H) 06/18/2017 0818   K 3.9 04/14/2016 0743   CL 101 08/13/2019 0820   CL 103 06/18/2017 0818   CO2 27 08/13/2019 0820   CO2 29 06/18/2017 0818   CO2 30 (H) 04/14/2016 0743   BUN 14 08/13/2019 0820   BUN 12 06/18/2017 0818   BUN 14.1 04/14/2016 0743   CREATININE 0.86 08/13/2019 0820   CREATININE 0.8 06/18/2017 0818   CREATININE 0.9 04/14/2016 0743      Component Value Date/Time   CALCIUM 8.4 (L) 08/13/2019 0820   CALCIUM 9.1 06/18/2017 0818   CALCIUM 9.4 04/14/2016 0743   ALKPHOS 74 08/13/2019  0820   ALKPHOS 67 06/18/2017 0818   ALKPHOS 68 04/14/2016 0743   AST 12 (L) 08/13/2019 0820   AST 15 04/14/2016 0743   ALT 17 08/13/2019 0820   ALT 34 06/18/2017 0818   ALT 28 04/14/2016 0743   BILITOT 0.4 08/13/2019 0820   BILITOT 0.48 04/14/2016 0743  Impression and Plan: Ms. Polgar is a very pleasant 53 yo caucasian female with history of hyper-eosinophilia syndrome.  We diagnosed her 10 years ago.  She is done incredibly well.  I think we had her on Hydrea initially.  We then put her on prednisone.  We were able to taper her prednisone down if I get her off prednisone.  She also had a history of thromboembolic disease.  She had a blood clot in the right leg.  This was several years ago.  She is on aspirin for this now.  Her eosinophil percentage is fantastic.  It is normal.  We will continue to follow her yearly.  She knows that she can always come back to see [place] if there is a problem.     Volanda Napoleon, MD 8/26/20209:25 AM

## 2019-08-13 NOTE — Telephone Encounter (Signed)
lmom to inform patient of Aug 2021 appt per 8/26 LOS

## 2019-08-13 NOTE — Telephone Encounter (Signed)
Left msg to inform pateint of 8/31 iron appt at 1 pm per 8/26 result note

## 2019-08-18 ENCOUNTER — Other Ambulatory Visit: Payer: Self-pay | Admitting: Family

## 2019-08-18 ENCOUNTER — Other Ambulatory Visit: Payer: Self-pay

## 2019-08-18 ENCOUNTER — Inpatient Hospital Stay: Payer: 59

## 2019-08-18 VITALS — BP 102/80 | HR 78 | Temp 97.2°F | Resp 20

## 2019-08-18 DIAGNOSIS — M358 Other specified systemic involvement of connective tissue: Secondary | ICD-10-CM | POA: Diagnosis not present

## 2019-08-18 DIAGNOSIS — D509 Iron deficiency anemia, unspecified: Secondary | ICD-10-CM

## 2019-08-18 DIAGNOSIS — D649 Anemia, unspecified: Secondary | ICD-10-CM

## 2019-08-18 MED ORDER — SODIUM CHLORIDE 0.9 % IV SOLN
Freq: Once | INTRAVENOUS | Status: AC
  Administered 2019-08-18: 14:00:00 via INTRAVENOUS
  Filled 2019-08-18: qty 250

## 2019-08-18 MED ORDER — SODIUM CHLORIDE 0.9 % IV SOLN
Freq: Once | INTRAVENOUS | Status: AC
  Filled 2019-08-18: qty 250

## 2019-08-18 MED ORDER — SODIUM CHLORIDE 0.9 % IV SOLN
200.0000 mg | Freq: Once | INTRAVENOUS | Status: AC
  Administered 2019-08-18: 15:00:00 200 mg via INTRAVENOUS
  Filled 2019-08-18: qty 10

## 2019-08-18 NOTE — Patient Instructions (Signed)

## 2020-08-11 ENCOUNTER — Inpatient Hospital Stay: Payer: 59 | Admitting: Hematology & Oncology

## 2020-08-11 ENCOUNTER — Ambulatory Visit: Payer: 59 | Admitting: Family

## 2020-08-11 ENCOUNTER — Inpatient Hospital Stay: Payer: 59

## 2020-08-11 ENCOUNTER — Other Ambulatory Visit: Payer: 59

## 2020-09-13 ENCOUNTER — Other Ambulatory Visit: Payer: Self-pay | Admitting: Family

## 2020-09-13 DIAGNOSIS — D509 Iron deficiency anemia, unspecified: Secondary | ICD-10-CM

## 2020-09-13 DIAGNOSIS — D7219 Other eosinophilia: Secondary | ICD-10-CM

## 2020-09-14 ENCOUNTER — Other Ambulatory Visit: Payer: Self-pay

## 2020-09-14 ENCOUNTER — Inpatient Hospital Stay: Payer: 59 | Attending: Hematology & Oncology

## 2020-09-14 ENCOUNTER — Inpatient Hospital Stay (HOSPITAL_BASED_OUTPATIENT_CLINIC_OR_DEPARTMENT_OTHER): Payer: 59 | Admitting: Family

## 2020-09-14 ENCOUNTER — Encounter: Payer: Self-pay | Admitting: Family

## 2020-09-14 VITALS — BP 111/84 | HR 102 | Temp 98.4°F | Resp 18 | Ht 64.17 in | Wt 250.0 lb

## 2020-09-14 DIAGNOSIS — D509 Iron deficiency anemia, unspecified: Secondary | ICD-10-CM | POA: Diagnosis not present

## 2020-09-14 DIAGNOSIS — Z86718 Personal history of other venous thrombosis and embolism: Secondary | ICD-10-CM | POA: Insufficient documentation

## 2020-09-14 DIAGNOSIS — Z794 Long term (current) use of insulin: Secondary | ICD-10-CM | POA: Insufficient documentation

## 2020-09-14 DIAGNOSIS — D72119 Hypereosinophilic syndrome (hes), unspecified: Secondary | ICD-10-CM | POA: Insufficient documentation

## 2020-09-14 DIAGNOSIS — R5383 Other fatigue: Secondary | ICD-10-CM | POA: Insufficient documentation

## 2020-09-14 DIAGNOSIS — E119 Type 2 diabetes mellitus without complications: Secondary | ICD-10-CM | POA: Diagnosis not present

## 2020-09-14 DIAGNOSIS — Z7982 Long term (current) use of aspirin: Secondary | ICD-10-CM | POA: Diagnosis not present

## 2020-09-14 DIAGNOSIS — D7219 Other eosinophilia: Secondary | ICD-10-CM

## 2020-09-14 LAB — CMP (CANCER CENTER ONLY)
ALT: 18 U/L (ref 0–44)
AST: 16 U/L (ref 15–41)
Albumin: 4.3 g/dL (ref 3.5–5.0)
Alkaline Phosphatase: 66 U/L (ref 38–126)
Anion gap: 12 (ref 5–15)
BUN: 11 mg/dL (ref 6–20)
CO2: 28 mmol/L (ref 22–32)
Calcium: 9.7 mg/dL (ref 8.9–10.3)
Chloride: 98 mmol/L (ref 98–111)
Creatinine: 0.86 mg/dL (ref 0.44–1.00)
GFR, Est AFR Am: >60 mL/min (ref >60)
GFR, Estimated: >60 mL/min (ref >60)
Glucose, Bld: 118 mg/dL — ABNORMAL HIGH (ref 70–99)
Potassium: 3.6 mmol/L (ref 3.5–5.1)
Sodium: 138 mmol/L (ref 135–145)
Total Bilirubin: 0.5 mg/dL (ref 0.3–1.2)
Total Protein: 7.1 g/dL (ref 6.5–8.1)

## 2020-09-14 LAB — CBC WITH DIFFERENTIAL (CANCER CENTER ONLY)
Abs Immature Granulocytes: 0.04 10*3/uL (ref 0.00–0.07)
Basophils Absolute: 0.1 10*3/uL (ref 0.0–0.1)
Basophils Relative: 1 %
Eosinophils Absolute: 0.8 10*3/uL — ABNORMAL HIGH (ref 0.0–0.5)
Eosinophils Relative: 8 %
HCT: 39.9 % (ref 36.0–46.0)
Hemoglobin: 13.3 g/dL (ref 12.0–15.0)
Immature Granulocytes: 0 %
Lymphocytes Relative: 22 %
Lymphs Abs: 2.3 10*3/uL (ref 0.7–4.0)
MCH: 27.7 pg (ref 26.0–34.0)
MCHC: 33.3 g/dL (ref 30.0–36.0)
MCV: 83 fL (ref 80.0–100.0)
Monocytes Absolute: 0.6 10*3/uL (ref 0.1–1.0)
Monocytes Relative: 6 %
Neutro Abs: 6.6 10*3/uL (ref 1.7–7.7)
Neutrophils Relative %: 63 %
Platelet Count: 275 10*3/uL (ref 150–400)
RBC: 4.81 MIL/uL (ref 3.87–5.11)
RDW: 13.4 % (ref 11.5–15.5)
WBC Count: 10.4 10*3/uL (ref 4.0–10.5)
nRBC: 0 % (ref 0.0–0.2)

## 2020-09-14 LAB — LACTATE DEHYDROGENASE: LDH: 162 U/L (ref 98–192)

## 2020-09-14 LAB — IRON AND TIBC
Iron: 39 ug/dL — ABNORMAL LOW (ref 41–142)
Saturation Ratios: 10 % — ABNORMAL LOW (ref 21–57)
TIBC: 379 ug/dL (ref 236–444)
UIBC: 340 ug/dL (ref 120–384)

## 2020-09-14 LAB — FERRITIN: Ferritin: 35 ng/mL (ref 11–307)

## 2020-09-14 NOTE — Progress Notes (Signed)
Hematology and Oncology Follow Up Visit  Margaret Gross [id number] 11/09/1966 54 y.o. 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 is here today for follow-up. She is doing quite well and has no complaints at this time. Eosinophils are 8%.  She has occasional itching and seasonal allergies but states that this is tolerable.  She has fatigue at times and will take a break to rest when needed. Iron studies are pending.  She states that she had her colonoscopy 2 weeks ago and her results were negative.  She has an IUD in place and does not have a cycle. She states that she rarely spots.  No other blood loss. No abnormal bruising or petechiae.  No fever, chills, n/v, cough, rash, dizziness, SOB, chest pain, palpitations, abdominal pain or changes in bowel or bladder habits.  No swelling, tenderness, numbness or tingling in her extremities.  No falls or syncope.  She has maintained a good appetite and is staying well hydrated. Her weight is stable.   ECOG Performance Status: 1 - Symptomatic but completely ambulatory  Medications:  Allergies as of 09/14/2020      Reactions   Ciprofloxacin    Ciprofloxacin Hcl       Medication List       Accurate as of September 14, 2020  8:43 AM. If you have any questions, ask your nurse or doctor.        aspirin 325 MG tablet Take 2 tablets (650 mg total) by mouth daily.   atorvastatin 10 MG tablet Commonly known as: LIPITOR Take 10 mg by mouth daily.   BD Pen Needle Nano U/F 32G X 4 MM Misc Generic drug: Insulin Pen Needle   clonazePAM 0.5 MG tablet Commonly known as: KLONOPIN Take 0.5 mg by mouth at bedtime as needed for anxiety.   fluticasone 50 MCG/ACT nasal spray Commonly known as: FLONASE Take 50 mcg by mouth  as needed.   Januvia 100 MG tablet Generic drug: sitaGLIPtin Take 100 mg by mouth daily.   Lantus SoloStar 100 UNIT/ML Solostar Pen Generic drug: insulin glargine 100 mLs. 08/13/2019 65units at bedtime   metFORMIN 500 MG tablet Commonly known as: GLUCOPHAGE 1,000 mg 2 (two) times daily.   Naftin 1 % Gel Generic drug: Naftifine HCl Naftin 1 % topical gel   ONE TOUCH ULTRA TEST test strip Generic drug: glucose blood   onetouch ultrasoft lancets   quinapril-hydrochlorothiazide 20-25 MG tablet Commonly known as: ACCURETIC Take 20-25 mg by mouth Daily.   sertraline 50 MG tablet Commonly known as: ZOLOFT Take 50 mg by mouth daily.   SUMAtriptan 100 MG tablet Commonly known as: IMITREX Take 100 mg by mouth as needed.   traMADol 50 MG tablet Commonly known as: ULTRAM       Allergies:  Allergies  Allergen Reactions  . Ciprofloxacin   . Ciprofloxacin Hcl     Past Medical History, Surgical history, Social history, and Family History were reviewed and updated.  Review of Systems: All other 10 point review of systems is negative.   Physical Exam:  vitals were not taken for this visit.   Wt Readings from Last 3 Encounters:  08/13/19 253 lb 12.8 oz (115.1 kg)  06/17/18 233 lb (105.7 kg)  06/18/17 267 lb (121.1 kg)  Ocular: Sclerae unicteric, pupils equal, round and reactive to light Ear-nose-throat: Oropharynx clear, dentition fair Lymphatic: No cervical or supraclavicular adenopathy Lungs no rales or rhonchi, good excursion bilaterally Heart regular rate and rhythm, no murmur appreciated Abd soft, nontender, positive bowel sounds MSK no focal spinal tenderness, no joint edema Neuro: non-focal, well-oriented, appropriate affect Breasts: Deferred    Lab Results  Component Value Date   WBC 10.4 09/14/2020   HGB 13.3 09/14/2020   HCT 39.9 09/14/2020   MCV 83.0 09/14/2020   PLT 275 09/14/2020   Lab Results  Component Value Date   FERRITIN 32 08/13/2019     IRON 33 (L) 08/13/2019   TIBC 321 08/13/2019   UIBC 289 08/13/2019   IRONPCTSAT 10 (L) 08/13/2019   Lab Results  Component Value Date   RETICCTPCT 1.2 12/22/2011   RBC 4.81 09/14/2020   RETICCTABS 56.2 12/22/2011   No results found for: KPAFRELGTCHN, LAMBDASER, KAPLAMBRATIO No results found for: IGGSERUM, IGA, IGMSERUM No results found for: Marda Stalker, SPEI   Chemistry      Component Value Date/Time   NA 139 08/13/2019 0820   NA 140 06/18/2017 0818   NA 140 04/14/2016 0743   K 3.4 (L) 08/13/2019 0820   K 5.0 (H) 06/18/2017 0818   K 3.9 04/14/2016 0743   CL 101 08/13/2019 0820   CL 103 06/18/2017 0818   CO2 27 08/13/2019 0820   CO2 29 06/18/2017 0818   CO2 30 (H) 04/14/2016 0743   BUN 14 08/13/2019 0820   BUN 12 06/18/2017 0818   BUN 14.1 04/14/2016 0743   CREATININE 0.86 08/13/2019 0820   CREATININE 0.8 06/18/2017 0818   CREATININE 0.9 04/14/2016 0743      Component Value Date/Time   CALCIUM 8.4 (L) 08/13/2019 0820   CALCIUM 9.1 06/18/2017 0818   CALCIUM 9.4 04/14/2016 0743   ALKPHOS 74 08/13/2019 0820   ALKPHOS 67 06/18/2017 0818   ALKPHOS 68 04/14/2016 0743   AST 12 (L) 08/13/2019 0820   AST 15 04/14/2016 0743   ALT 17 08/13/2019 0820   ALT 34 06/18/2017 0818   ALT 28 04/14/2016 0743   BILITOT 0.4 08/13/2019 0820   BILITOT 0.48 04/14/2016 0743       Impression and Plan: [name] is a very pleasant 54 yo caucasian female with history of hypereosinophilia. Eosinophil count is stable at 0.8 and she remains asymptomatic. No intervention needed at this time.  She also has history of thromboembolic disease with DVT of the right leg. She is doing well on 1 full dose aspirin daily and will continue her same regimen.  We will continue to follow along with her annually.  She can contact our office with any questions or concerns.  [name], NP 9/28/20218:43 AM

## 2020-09-17 ENCOUNTER — Inpatient Hospital Stay: Payer: 59 | Attending: Hematology & Oncology

## 2020-09-17 ENCOUNTER — Other Ambulatory Visit: Payer: Self-pay

## 2020-09-17 VITALS — BP 96/58 | HR 85 | Temp 97.5°F | Resp 20

## 2020-09-17 DIAGNOSIS — Z79899 Other long term (current) drug therapy: Secondary | ICD-10-CM | POA: Insufficient documentation

## 2020-09-17 DIAGNOSIS — D509 Iron deficiency anemia, unspecified: Secondary | ICD-10-CM | POA: Insufficient documentation

## 2020-09-17 MED ORDER — SODIUM CHLORIDE 0.9 % IV SOLN
Freq: Once | INTRAVENOUS | Status: AC
  Filled 2020-09-17: qty 250

## 2020-09-17 MED ORDER — SODIUM CHLORIDE 0.9 % IV SOLN
200.0000 mg | Freq: Once | INTRAVENOUS | Status: AC
  Administered 2020-09-17: 200 mg via INTRAVENOUS
  Filled 2020-09-17: qty 200

## 2020-09-17 NOTE — Patient Instructions (Signed)

## 2020-09-20 ENCOUNTER — Inpatient Hospital Stay: Payer: 59

## 2020-09-20 ENCOUNTER — Other Ambulatory Visit: Payer: Self-pay | Admitting: Family

## 2020-09-20 ENCOUNTER — Other Ambulatory Visit: Payer: Self-pay

## 2020-09-20 VITALS — BP 101/71 | HR 79 | Temp 98.2°F | Resp 18

## 2020-09-20 DIAGNOSIS — D509 Iron deficiency anemia, unspecified: Secondary | ICD-10-CM | POA: Diagnosis not present

## 2020-09-20 MED ORDER — SODIUM CHLORIDE 0.9 % IV SOLN
200.0000 mg | Freq: Once | INTRAVENOUS | Status: AC
  Administered 2020-09-20: 200 mg via INTRAVENOUS
  Filled 2020-09-20: qty 10

## 2020-09-20 MED ORDER — SODIUM CHLORIDE 0.9 % IV SOLN
Freq: Once | INTRAVENOUS | Status: AC
  Filled 2020-09-20: qty 250

## 2020-09-20 NOTE — Patient Instructions (Signed)

## 2020-09-24 ENCOUNTER — Inpatient Hospital Stay: Payer: 59

## 2020-09-24 ENCOUNTER — Other Ambulatory Visit: Payer: Self-pay

## 2020-09-24 VITALS — BP 108/64 | HR 82 | Temp 97.7°F | Resp 18

## 2020-09-24 DIAGNOSIS — D509 Iron deficiency anemia, unspecified: Secondary | ICD-10-CM | POA: Diagnosis not present

## 2020-09-24 MED ORDER — SODIUM CHLORIDE 0.9 % IV SOLN
200.0000 mg | Freq: Once | INTRAVENOUS | Status: AC
  Administered 2020-09-24: 200 mg via INTRAVENOUS
  Filled 2020-09-24: qty 200

## 2020-09-24 MED ORDER — SODIUM CHLORIDE 0.9 % IV SOLN
Freq: Once | INTRAVENOUS | Status: AC
  Filled 2020-09-24: qty 250

## 2020-09-24 NOTE — Patient Instructions (Signed)

## 2020-09-27 ENCOUNTER — Other Ambulatory Visit: Payer: Self-pay

## 2020-09-27 ENCOUNTER — Inpatient Hospital Stay: Payer: 59

## 2020-09-27 VITALS — BP 108/62 | HR 89 | Temp 98.2°F | Resp 18

## 2020-09-27 DIAGNOSIS — D509 Iron deficiency anemia, unspecified: Secondary | ICD-10-CM | POA: Diagnosis not present

## 2020-09-27 MED ORDER — SODIUM CHLORIDE 0.9 % IV SOLN
200.0000 mg | Freq: Once | INTRAVENOUS | Status: AC
  Administered 2020-09-27: 200 mg via INTRAVENOUS
  Filled 2020-09-27: qty 200

## 2020-09-27 MED ORDER — SODIUM CHLORIDE 0.9 % IV SOLN
Freq: Once | INTRAVENOUS | Status: AC
  Filled 2020-09-27: qty 250

## 2020-09-27 NOTE — Patient Instructions (Addendum)

## 2020-09-30 ENCOUNTER — Inpatient Hospital Stay: Payer: 59

## 2020-09-30 ENCOUNTER — Other Ambulatory Visit: Payer: Self-pay

## 2020-09-30 VITALS — BP 115/71 | HR 76 | Temp 98.9°F | Resp 18

## 2020-09-30 DIAGNOSIS — D509 Iron deficiency anemia, unspecified: Secondary | ICD-10-CM

## 2020-09-30 MED ORDER — SODIUM CHLORIDE 0.9 % IV SOLN
Freq: Once | INTRAVENOUS | Status: AC
  Filled 2020-09-30: qty 250

## 2020-09-30 MED ORDER — SODIUM CHLORIDE 0.9% FLUSH
3.0000 mL | Freq: Once | INTRAVENOUS | Status: DC | PRN
  Filled 2020-09-30: qty 10

## 2020-09-30 MED ORDER — SODIUM CHLORIDE 0.9 % IV SOLN
200.0000 mg | Freq: Once | INTRAVENOUS | Status: AC
  Administered 2020-09-30: 200 mg via INTRAVENOUS
  Filled 2020-09-30: qty 200

## 2020-09-30 MED ORDER — SODIUM CHLORIDE 0.9% FLUSH
10.0000 mL | Freq: Once | INTRAVENOUS | Status: DC | PRN
  Filled 2020-09-30: qty 10

## 2020-09-30 NOTE — Progress Notes (Signed)
Pt. Stable at discharge. 

## 2020-09-30 NOTE — Patient Instructions (Signed)

## 2021-08-29 ENCOUNTER — Encounter (HOSPITAL_BASED_OUTPATIENT_CLINIC_OR_DEPARTMENT_OTHER): Payer: Self-pay | Admitting: *Deleted

## 2021-08-29 ENCOUNTER — Other Ambulatory Visit: Payer: Self-pay

## 2021-08-29 ENCOUNTER — Emergency Department (HOSPITAL_BASED_OUTPATIENT_CLINIC_OR_DEPARTMENT_OTHER)
Admission: EM | Admit: 2021-08-29 | Discharge: 2021-08-29 | Disposition: A | Discharge status: Home / Self Care | Payer: 59 | Attending: Emergency Medicine | Admitting: Emergency Medicine

## 2021-08-29 ENCOUNTER — Telehealth: Payer: Self-pay | Admitting: *Deleted

## 2021-08-29 ENCOUNTER — Emergency Department (HOSPITAL_BASED_OUTPATIENT_CLINIC_OR_DEPARTMENT_OTHER): Payer: 59

## 2021-08-29 DIAGNOSIS — Z7982 Long term (current) use of aspirin: Secondary | ICD-10-CM | POA: Insufficient documentation

## 2021-08-29 DIAGNOSIS — M79604 Pain in right leg: Secondary | ICD-10-CM | POA: Diagnosis present

## 2021-08-29 HISTORY — DX: Acute embolism and thrombosis of unspecified deep veins of unspecified lower extremity: I82.409

## 2021-08-29 NOTE — Telephone Encounter (Signed)
Received call from patient and she stated,"I have had right calf pain since last night. It has started to hurt. When I point my big toe, the calf feels tight and thick. I can't tell if it is warm feeling or not." Per [name], NP, you need to go to the ER and be assessed. Dr. Myna Hidalgo is out of the office today and Sarah's schedule is full. She verbalized understanding.

## 2021-08-29 NOTE — ED Provider Notes (Signed)
[facility] EMERGENCY DEPARTMENT Provider Note   CSN: [id number] Arrival date & time: 08/29/21  1341     History Chief Complaint  Patient presents with   Leg Pain    Margaret Gross is a 55 y.o. female who presents to the ED today with complaint of gradual onset, constant, achy, mild, R calf pain that she noticed yesterday.  Patient reports the pain is bearable.  She has been taking Tylenol as needed however has not felt like she is needed at significantly.  She called her hematologist today who she sees for anemia and they could not get her in and so they advised she come to the ED today.  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  She states that while in the waiting room she did start thinking about what else could be causing her pain and she mentions that she dropped a box on her foot about 1 week ago.  She states she is unsure if she had been walking abnormally due to pain in her right fourth toe with some bruising to same.  She denies any chest pain or shortness of breath.  She denies any calf swelling or redness.  She has no other complaints at this time.  She is not currently anticoagulated.  The history is provided by the patient and medical records.      Past Medical History:  Diagnosis Date   Hx of migraines    Ligament tear    right arm at age 87   pulmonary embol     Patient Active Problem List   Diagnosis Date Noted   Anemia 01/21/2009   EOSINOPHILIA 01/21/2009   FEVER UNSPECIFIED 01/21/2009    Past Surgical History:  Procedure Laterality Date   CESAREAN SECTION     CHOLECYSTECTOMY  2002   WISDOM TOOTH EXTRACTION     age 10     OB History     Gravida  1   Para  1   Term      Preterm      AB      Living  1      SAB      IAB      Ectopic      Multiple      Live Births              Family History  Problem Relation Age of Onset   Heart attack Father     Diabetes Father    Cancer Maternal Grandmother    Heart attack Paternal Grandfather        paranoid   Schizophrenia Other     Social History   Tobacco Use   Smoking status: Never   Smokeless tobacco: Never   Tobacco comments:    never used tobacco  Vaping Use   Vaping Use: Never used  Substance Use Topics   Alcohol use: Yes    Alcohol/week: 0.0 standard drinks    Comment: occasional    Drug use: No    Home Medications Prior to Admission medications   Medication Sig Start Date End Date Taking? Authorizing Provider  aspirin 325 MG tablet Take 2 tablets (650 mg total) by mouth daily. 08/13/19   Josph Macho, MD  atorvastatin (LIPITOR) 10 MG tablet Take 10 mg by mouth daily.    [provider]  BD PEN NEEDLE NANO U/F 32G X  4 MM MISC  09/29/15   [provider]  clonazePAM (KLONOPIN) 0.5 MG tablet Take 0.5 mg by mouth at bedtime as needed for anxiety.    [provider]  fluticasone (FLONASE) 50 MCG/ACT nasal spray Take 50 mcg by mouth as needed. 11/25/11   [provider]  Lancets Letta Pate ULTRASOFT) lancets  10/02/11   [provider]  LANTUS SOLOSTAR 100 UNIT/ML injection 100 mLs. 08/13/2019 65units at bedtime 10/13/11   [provider]  metFORMIN (GLUCOPHAGE) 500 MG tablet 1,000 mg 2 (two) times daily.    [provider]  ONE TOUCH ULTRA TEST test strip  08/10/15   [provider]  quinapril-hydrochlorothiazide (ACCURETIC) 20-25 MG per tablet Take 20-25 mg by mouth Daily. 11/20/11   [provider]  RYBELSUS 7 MG TABS Take 1 tablet by mouth daily. 07/25/20   [provider]  sertraline (ZOLOFT) 50 MG tablet Take 50 mg by mouth daily. 02/17/19   [provider]  SUMAtriptan (IMITREX) 100 MG tablet Take 100 mg by mouth as needed.  02/05/12   [provider]    Allergies    Ciprofloxacin  Review of Systems   Review of Systems  Constitutional:  Negative for chills and fever.   Musculoskeletal:  Positive for arthralgias. Negative for joint swelling.  Skin:  Negative for color change.  All other systems reviewed and are negative.  Physical Exam Updated Vital Signs BP (!) 122/91   Pulse 96   Temp 98.1 F (36.7 C) (Oral)   Resp 16   Ht 5\' 3"  (1.6 m)   Wt 113.4 kg   SpO2 99%   BMI 44.29 kg/m   Physical Exam Vitals and nursing note reviewed.  Constitutional:      Appearance: She is not ill-appearing or diaphoretic.  HENT:     Head: Normocephalic and atraumatic.  Eyes:     Conjunctiva/sclera: Conjunctivae normal.  Cardiovascular:     Rate and Rhythm: Normal rate and regular rhythm.     Pulses: Normal pulses.  Pulmonary:     Effort: Pulmonary effort is normal.     Breath sounds: Normal breath sounds.  Musculoskeletal:     Comments: Ecchymosis noted to R 4th toe with mild TTP. Cap refill < 2 seconds. No TTP to the R foot with 2+ DP pulse. Mild calf TTP without swelling or redness. ROM intact to RLE. Strength and sensation intact.   Skin:    General: Skin is warm and dry.     Coloration: Skin is not jaundiced.  Neurological:     Mental Status: She is alert.    ED Results / Procedures / Treatments   Labs (all labs ordered are listed, but only abnormal results are displayed) Labs Reviewed - No data to display  EKG None  Radiology Venous Img Lower Unilateral Right  Result Date: 08/29/2021 CLINICAL DATA:  Right lower extremity pain for the past 3 days. Remote history of DVT and pulmonary embolism. Evaluate for acute or chronic DVT. EXAM: RIGHT LOWER EXTREMITY VENOUS DOPPLER ULTRASOUND TECHNIQUE: Gray-scale sonography with graded compression, as well as color Doppler and duplex ultrasound were performed to evaluate the lower extremity deep venous systems from the level of the common femoral vein and including the common femoral, femoral, profunda femoral, popliteal and calf veins including the posterior tibial, peroneal and gastrocnemius veins when  visible. The superficial great saphenous vein was also interrogated. Spectral Doppler was utilized to evaluate flow at rest and with distal augmentation  maneuvers in the common femoral, femoral and popliteal veins. COMPARISON:  Right lower extremity venous Doppler ultrasound-07/25/2011; 02/08/2010 FINDINGS: Contralateral Common Femoral Vein: Respiratory phasicity is normal and symmetric with the symptomatic side. No evidence of thrombus. Normal compressibility. Common Femoral Vein: No evidence of thrombus. Normal compressibility, respiratory phasicity and response to augmentation. Saphenofemoral Junction: No evidence of thrombus. Normal compressibility and flow on color Doppler imaging. Profunda Femoral Vein: No evidence of thrombus. Normal compressibility and flow on color Doppler imaging. Femoral Vein: No evidence of thrombus. Normal compressibility, respiratory phasicity and response to augmentation. 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 Calf Veins: No evidence of thrombus. Normal compressibility and flow on color Doppler imaging. Superficial Great Saphenous Vein: No evidence of thrombus. Normal compressibility. Other Findings:  None. IMPRESSION: 1. No evidence of acute DVT within the right lower extremity. 2. 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 Electronically Signed   By: Simonne Come M.D.   On: 08/29/2021 14:42    Procedures Procedures   Medications Ordered in ED Medications - No data to display  ED Course  I have reviewed the triage vital signs and the nursing notes.  Pertinent labs & imaging results that were available during my care of the patient were reviewed by me and considered in my medical decision making (see chart for details).    MDM Rules/Calculators/A&P                            55 year old female who presents to the ED today with complaint of atraumatic right calf pain that began yesterday.  History of DVT/PE 12 to 14 years ago.  On arrival to the ED today patient is afebrile, nontachycardic and nontachypneic.  She did have a DVT study while in the waiting room which showed no acute evidence of acute DVT.  She does have some nonocclusive intimal web involving the right popliteal vein which was seen on a DVT study in 2012, thought to be likely due to sequela of remote DVT in the past.  On my exam she has no obvious swelling or redness to her calf.  She does have some mild tenderness palpation to same.  She does mention that she dropped a box on her right foot about 1 week ago and thinks she may have been walking abnormally which may have caused some pain in her calf.  She is overall comfortable appearing.  She is noted to have some ecchymosis to her right fourth toe with tenderness palpation.  I had offered x-ray to evaluate for any fractures however patient states that the pain is not very bad and she really was mostly concerned about a blood clot.  She feels comfortable going home at this time and taking ibuprofen and Tylenol as needed for pain with PCP follow-up.  She denies any chest pain or shortness of breath.  Do not feel she requires additional work-up at this time.  She is stable for discharge.  This note was prepared using Dragon voice recognition software and may include unintentional dictation errors due to the inherent limitations of voice recognition software.  Final Clinical Impression(s) / ED Diagnoses Final diagnoses:  Right leg pain    Rx / DC Orders ED Discharge Orders     None  Discharge Instructions      Your ultrasound did not show any signs of blood clots at this time. I would recommend ibuprofen/tylenol as needed for pain, elevation of your leg, icing the leg and following up with your PCP.   If your pain persists > 10 days you  may need a repeat ultrasound.   Return to the ED for any new/worsening symptoms       Tanda RockersVenter, Edelmiro Innocent, PA-C 08/29/21 1524    Edwin DadaGray, Alicia P, DO 08/30/21 1606

## 2021-08-29 NOTE — Discharge Instructions (Addendum)
Your ultrasound did not show any signs of blood clots at this time. I would recommend ibuprofen/tylenol as needed for pain, elevation of your leg, icing the leg and following up with your PCP.   If your pain persists > 10 days you may need a repeat ultrasound.   Return to the ED for any new/worsening symptoms

## 2021-08-29 NOTE — ED Triage Notes (Signed)
C/o right calf pain x 3 days HX dvt and PE

## 2021-09-13 ENCOUNTER — Other Ambulatory Visit: Payer: Self-pay

## 2021-09-13 DIAGNOSIS — D509 Iron deficiency anemia, unspecified: Secondary | ICD-10-CM

## 2021-09-14 ENCOUNTER — Other Ambulatory Visit: Payer: Self-pay

## 2021-09-14 ENCOUNTER — Inpatient Hospital Stay: Payer: 59 | Attending: Hematology & Oncology | Admitting: Hematology & Oncology

## 2021-09-14 ENCOUNTER — Telehealth: Payer: Self-pay | Admitting: Hematology & Oncology

## 2021-09-14 ENCOUNTER — Inpatient Hospital Stay: Payer: 59

## 2021-09-14 VITALS — BP 108/56 | HR 92 | Temp 98.1°F | Resp 16

## 2021-09-14 DIAGNOSIS — Z7982 Long term (current) use of aspirin: Secondary | ICD-10-CM | POA: Diagnosis not present

## 2021-09-14 DIAGNOSIS — Z86718 Personal history of other venous thrombosis and embolism: Secondary | ICD-10-CM | POA: Insufficient documentation

## 2021-09-14 DIAGNOSIS — D72119 Hypereosinophilic syndrome (hes), unspecified: Secondary | ICD-10-CM | POA: Diagnosis not present

## 2021-09-14 DIAGNOSIS — D509 Iron deficiency anemia, unspecified: Secondary | ICD-10-CM

## 2021-09-14 DIAGNOSIS — Z794 Long term (current) use of insulin: Secondary | ICD-10-CM | POA: Insufficient documentation

## 2021-09-14 DIAGNOSIS — D721 Eosinophilia, unspecified: Secondary | ICD-10-CM

## 2021-09-14 DIAGNOSIS — E119 Type 2 diabetes mellitus without complications: Secondary | ICD-10-CM | POA: Diagnosis not present

## 2021-09-14 LAB — CMP (CANCER CENTER ONLY)
ALT: 29 U/L (ref 0–44)
AST: 19 U/L (ref 15–41)
Albumin: 4.2 g/dL (ref 3.5–5.0)
Alkaline Phosphatase: 61 U/L (ref 38–126)
Anion gap: 11 (ref 5–15)
BUN: 11 mg/dL (ref 6–20)
CO2: 28 mmol/L (ref 22–32)
Calcium: 9.5 mg/dL (ref 8.9–10.3)
Chloride: 101 mmol/L (ref 98–111)
Creatinine: 0.9 mg/dL (ref 0.44–1.00)
GFR, Estimated: >60 mL/min (ref >60)
Glucose, Bld: 154 mg/dL — ABNORMAL HIGH (ref 70–99)
Potassium: 4.2 mmol/L (ref 3.5–5.1)
Sodium: 140 mmol/L (ref 135–145)
Total Bilirubin: 0.5 mg/dL (ref 0.3–1.2)
Total Protein: 6.9 g/dL (ref 6.5–8.1)

## 2021-09-14 LAB — CBC WITH DIFFERENTIAL (CANCER CENTER ONLY)
Abs Immature Granulocytes: 0.02 10*3/uL (ref 0.00–0.07)
Basophils Absolute: 0.1 10*3/uL (ref 0.0–0.1)
Basophils Relative: 1 %
Eosinophils Absolute: 0.6 10*3/uL — ABNORMAL HIGH (ref 0.0–0.5)
Eosinophils Relative: 8 %
HCT: 39.5 % (ref 36.0–46.0)
Hemoglobin: 13.3 g/dL (ref 12.0–15.0)
Immature Granulocytes: 0 %
Lymphocytes Relative: 32 %
Lymphs Abs: 2.5 10*3/uL (ref 0.7–4.0)
MCH: 29.6 pg (ref 26.0–34.0)
MCHC: 33.7 g/dL (ref 30.0–36.0)
MCV: 87.8 fL (ref 80.0–100.0)
Monocytes Absolute: 0.5 10*3/uL (ref 0.1–1.0)
Monocytes Relative: 7 %
Neutro Abs: 4.1 10*3/uL (ref 1.7–7.7)
Neutrophils Relative %: 52 %
Platelet Count: 240 10*3/uL (ref 150–400)
RBC: 4.5 MIL/uL (ref 3.87–5.11)
RDW: 12.9 % (ref 11.5–15.5)
WBC Count: 7.8 10*3/uL (ref 4.0–10.5)
nRBC: 0 % (ref 0.0–0.2)

## 2021-09-14 LAB — IRON AND TIBC
Iron: 54 ug/dL (ref 41–142)
Saturation Ratios: 17 % — ABNORMAL LOW (ref 21–57)
TIBC: 319 ug/dL (ref 236–444)
UIBC: 265 ug/dL (ref 120–384)

## 2021-09-14 LAB — FERRITIN: Ferritin: 91 ng/mL (ref 11–307)

## 2021-09-14 LAB — LACTATE DEHYDROGENASE: LDH: 155 U/L (ref 98–192)

## 2021-09-14 NOTE — Telephone Encounter (Signed)
NO los 9/28

## 2021-09-14 NOTE — Progress Notes (Signed)
Hematology and Oncology Follow Up Visit  Margaret Gross [id number] Oct 24, 1966 55 y.o. 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  We see her yearly.  I am not seen her for 2 years.  It was a really nice catching up with her.  I will paste that her boys now is 53 years old.  He graduated high school.  He is now a [employer].  I saw a picture of his [employer] project.  She has had no problems with the diabetes.  She has had her last hemoglobin A1c was 6.  She has had no problems with bleeding or bruising.  She gets her mammograms yearly.  She is on aspirin.  She is doing well with the aspirin.  Pressures had no problems with rashes.  There is been no itching.  Sometimes her skin does get a little bit dry.  She has had no issues with COVID.  She and her husband and son were all very well protected.  Currently, I would say performance status is probably ECOG 0.    Medications:  Allergies as of 09/14/2021       Reactions   Ciprofloxacin Other (See Comments)   UNKNOWN REACTION        Medication List        Accurate as of September 14, 2021  8:09 AM. If you have any questions, ask your nurse or doctor.          aspirin 325 MG tablet Take 2 tablets (650 mg total) by mouth daily.   atorvastatin 10 MG tablet Commonly known as: LIPITOR Take 10 mg by mouth daily.   BD Pen Needle Nano U/F 32G X 4 MM Misc Generic drug: Insulin Pen Needle   clonazePAM 0.5 MG tablet Commonly known as: KLONOPIN Take 0.5 mg by mouth at bedtime as needed for anxiety.   fluticasone 50 MCG/ACT nasal spray Commonly known as: FLONASE Take 50 mcg by mouth as needed.   Lantus SoloStar 100 UNIT/ML Solostar Pen Generic drug: insulin glargine 100  mLs. 08/13/2019 65units at bedtime   metFORMIN 500 MG tablet Commonly known as: GLUCOPHAGE 1,000 mg 2 (two) times daily.   ONE TOUCH ULTRA TEST test strip Generic drug: glucose blood   onetouch ultrasoft lancets   quinapril-hydrochlorothiazide 20-25 MG tablet Commonly known as: ACCURETIC Take 20-25 mg by mouth Daily.   Rybelsus 7 MG Tabs Generic drug: Semaglutide Take 1 tablet by mouth daily.   sertraline 50 MG tablet Commonly known as: ZOLOFT Take 50 mg by mouth daily.   SUMAtriptan 100 MG tablet Commonly known as: IMITREX Take 100 mg by mouth as needed.        Allergies:  Allergies  Allergen Reactions   Ciprofloxacin Other (See Comments)    UNKNOWN REACTION    Past Medical History, Surgical history, Social history, and Family History were reviewed and updated.  Review of Systems: All other 10 point review of systems is negative.   Physical Exam:  oral temperature is 98.1 F (36.7 C). Her blood pressure is 108/56 (abnormal) and her pulse is 92. Her respiration is 16 and oxygen saturation is 99%.   Wt Readings from Last 3 Encounters:  08/29/21 250 lb (113.4 kg)  09/14/20  250 lb (113.4 kg)  08/13/19 253 lb 12.8 oz (115.1 kg)    Ocular: Sclerae unicteric, pupils equal, round and reactive to light Ear-nose-throat: Oropharynx clear, dentition fair Lymphatic: No cervical or supraclavicular adenopathy Lungs no rales or rhonchi, good excursion bilaterally Heart regular rate and rhythm, no murmur appreciated Abd soft, nontender, positive bowel sounds MSK no focal spinal tenderness, no joint edema Neuro: non-focal, well-oriented, appropriate affect Breasts: Deferred    Lab Results  Component Value Date   WBC 7.8 09/14/2021   HGB 13.3 09/14/2021   HCT 39.5 09/14/2021   MCV 87.8 09/14/2021   PLT 240 09/14/2021   Lab Results  Component Value Date   FERRITIN 35 09/14/2020   IRON 39 (L) 09/14/2020   TIBC 379 09/14/2020   UIBC 340 09/14/2020   IRONPCTSAT  10 (L) 09/14/2020   Lab Results  Component Value Date   RETICCTPCT 1.2 12/22/2011   RBC 4.50 09/14/2021   RETICCTABS 56.2 12/22/2011   No results found for: KPAFRELGTCHN, LAMBDASER, KAPLAMBRATIO No results found for: IGGSERUM, IGA, IGMSERUM No results found for: Georgann Housekeeper, MSPIKE, SPEI   Chemistry      Component Value Date/Time   NA 138 09/14/2020 0826   NA 140 06/18/2017 0818   NA 140 04/14/2016 0743   K 3.6 09/14/2020 0826   K 5.0 (H) 06/18/2017 0818   K 3.9 04/14/2016 0743   CL 98 09/14/2020 0826   CL 103 06/18/2017 0818   CO2 28 09/14/2020 0826   CO2 29 06/18/2017 0818   CO2 30 (H) 04/14/2016 0743   BUN 11 09/14/2020 0826   BUN 12 06/18/2017 0818   BUN 14.1 04/14/2016 0743   CREATININE 0.86 09/14/2020 0826   CREATININE 0.8 06/18/2017 0818   CREATININE 0.9 04/14/2016 0743      Component Value Date/Time   CALCIUM 9.7 09/14/2020 0826   CALCIUM 9.1 06/18/2017 0818   CALCIUM 9.4 04/14/2016 0743   ALKPHOS 66 09/14/2020 0826   ALKPHOS 67 06/18/2017 0818   ALKPHOS 68 04/14/2016 0743   AST 16 09/14/2020 0826   AST 15 04/14/2016 0743   ALT 18 09/14/2020 0826   ALT 34 06/18/2017 0818   ALT 28 04/14/2016 0743   BILITOT 0.5 09/14/2020 0826   BILITOT 0.48 04/14/2016 0743       Impression and Plan: Margaret Gross is a very pleasant 55 yo caucasian female with history of hypereosinophilia.  This really is not a problem for her.  We have not seen her now for about 12 years.  She has been in remission for at least 3 years..    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  She is on aspirin.  This point, I think we can let her go from the clinic.  I do not think we are adding much to her health care right now.  She is incredibly proactive with her health.  Will be more than happy to see her back in the future if she does have any problems.  Josph Macho, MD 9/28/20228:09 AM

## 2023-01-02 ENCOUNTER — Ambulatory Visit (INDEPENDENT_AMBULATORY_CARE_PROVIDER_SITE_OTHER): Payer: 59 | Admitting: Internal Medicine

## 2023-01-02 ENCOUNTER — Encounter: Payer: Self-pay | Admitting: Internal Medicine

## 2023-01-02 VITALS — BP 104/70 | HR 102 | Temp 97.7°F | Resp 16 | Ht 62.6 in | Wt 247.3 lb

## 2023-01-02 DIAGNOSIS — J310 Chronic rhinitis: Secondary | ICD-10-CM | POA: Diagnosis not present

## 2023-01-02 DIAGNOSIS — J4530 Mild persistent asthma, uncomplicated: Secondary | ICD-10-CM

## 2023-01-02 DIAGNOSIS — D72110 Idiopathic hypereosinophilic syndrome (ihes): Secondary | ICD-10-CM | POA: Diagnosis not present

## 2023-01-02 DIAGNOSIS — H1045 Other chronic allergic conjunctivitis: Secondary | ICD-10-CM | POA: Diagnosis not present

## 2023-01-02 DIAGNOSIS — B999 Unspecified infectious disease: Secondary | ICD-10-CM

## 2023-01-02 DIAGNOSIS — H109 Unspecified conjunctivitis: Secondary | ICD-10-CM | POA: Insufficient documentation

## 2023-01-02 DIAGNOSIS — Z86711 Personal history of pulmonary embolism: Secondary | ICD-10-CM | POA: Insufficient documentation

## 2023-01-02 MED ORDER — BUDESONIDE 90 MCG/ACT IN AEPB: 1.0000 | INHALATION_SPRAY | Freq: Two times a day (BID) | RESPIRATORY_TRACT | 5 refills | Status: DC

## 2023-01-02 NOTE — Progress Notes (Signed)
New Patient Note  RE: Margaret Gross MRN: [id number] DOB: Jul 11, 1966 Date of Office Visit: 01/02/2023  Consult requested by: Lennie Odor, PA Primary care provider: Lennie Odor, PA  Chief Complaint: Breathing Problem (Hx of bronchitis, pneumonia, PE traveled to her lungs inhaler use), Allergic Rhinitis  (All year round and extra ordinary phlegm in your her throat every morning.), and Nasal Congestion  History of Present Illness: I had the pleasure of seeing Margaret Gross for initial evaluation at the [facility] on 01/05/2023. She is a 57 y.o. female, who is referred here by Lennie Odor, PA for the evaluation of bronchitis, allergic.  History obtained from patient, chart review.   Bronchitis  Occurs 1-2 times per year prior to COVID, however since isolation has decreased she has had 2 episodes of bronchitis and RSV one time (last occurrence 12/23) Presents as cough with post tussive emesis, wheezing, dyspnea which will last a few weeks  Sometimes prescribed albuterol with some benefit, sometimes prescribed OCS Past 12 months: 1 OCS course  PNA: 2007 associated with PE  No prior PFTS Does have nocturnal coughing  AEC (09/14/21): 600 CXR 10/15/13: No active lung disease.   Chronic rhinitis: started as a young child, worsened as she has gotten older  Symptoms include:  ear infections, sinus infections (1-2 times per year), nasal congestion, rhinorrhea, post nasal drainage, sneezing, watery eyes, itchy eyes, and itchy nose  Occurs  previously seasonally early spring  and fall, now perennial  Potential triggers: pollen, cats, Treatments tried: zyzal and clartin, singulair  Previous allergy testing:  35 years ago  History of reflux/heartburn:  rarely  History of chronic sinusitis or sinus surgery: no Nonallergic triggers:  denies      HE'S 2010 she was hospitalized due to rashes, fatigue, altered mental status and dsypnea.  She was noted to have  hypereosinophilia (30% of WBC) and worked up for hypereosinophilic disorders.  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  Parasitic infections and eosinophilic leukemia was ruled out.  She was treated with high dose corticosteroids and improved.  No skin biopsy per patient, but she cannot recall details.  She was followed by oncology for 10 years and AEC has remained < 1500.  She graduated from oncology last year.  She is unsure if they did genetic testing on her or rule out drug reaction.    Assessment and Plan: [name] is a 57 y.o. female with: Chronic rhinitis  Mild persistent asthma without complication - Plan: Spirometry with Graph  Idiopathic hypereosinophilic syndrome - Plan: CBC With Differential  Other chronic allergic conjunctivitis of both eyes  History of pulmonary embolism  Recurrent infections Plan: Patient Instructions  Recurrent bronchitis: suspect underlying mild persistent asthma   - Breathing test today showed: looked okay, but based on symptoms we should start a controller therapy   PLAN:  - Spacer use reviewed. - Daily controller medication(s): Singulair 10mg  daily and Pulmicort Flexhaler 39mcg 1 puffs twice daily - Prior to physical activity: albuterol 2 puffs 10-15 minutes before physical activity. - Rescue medications: albuterol 4 puffs every 4-6 hours as needed - Changes during respiratory infections or worsening symptoms: Increase Pulmicort  to 3 puffs twice daily for TWO WEEKS. - Asthma control goals:  * Full participation in all desired activities (may need albuterol before activity) * Albuterol use two time or less a week on average (not counting use with activity) * Cough interfering with sleep two time  or less a month * Oral steroids no more than once a year * No hospitalizations  Chronic  Rhinitis not well controlled: - allergy testing today was deferred due to [facility] insurance, will have patient come back for skin  testing  - consider allergy shots as long term control of your symptoms by teaching your immune system to be more tolerant of your allergy triggers  -Information provided today to include standard and RUSH build up  - Start ryatris  2 sprays per nostril daily (sample provided) if you like we can prescribed this  - Start nasal sinus rinses 10-15 minutes prior to nose sprays  - Continue Singulair (Montelukast) 10mg  nightly. - Continue over the counter antihistamine daily or daily as needed.   -Your options include Zyrtec (Cetirizine) 10mg , Claritin (Loratadine) 10mg , Allegra (Fexofenadine) 180mg , or Xyzal (Levocetirinze) 5mg   Allergic Conjunctivitis:  - Consider Allergy Eye drops: great options include Pataday (Olopatadine) or Zaditor (ketotifen) for eye symptoms daily as needed-both sold over the counter if not covered by insurance.   -Avoid eye drops that say red eye relief as they may contain medications that dry out your eyes.   HES: currently asymptomatic/resolved  - We will review all previous labs and hospitalizations  - get CBC today, if at goal plan for yearly monitoring   Follow up: 1 week for allergy skin testing to the environmentals   Thank you so much for letting me partake in your care today.  Don't hesitate to reach out if you have any additional concerns!  Roney Marion, MD  [facility] [place] [place]   Meds ordered this encounter  Medications   DISCONTD: Budesonide 90 MCG/ACT inhaler    Sig: Inhale 1 puff into the lungs 2 (two) times daily.    Dispense:  1 each    Refill:  5   Budesonide 90 MCG/ACT inhaler    Sig: Inhale 1 puff into the lungs 2 (two) times daily.    Dispense:  1 each    Refill:  5   Lab Orders         CBC With Differential      Other allergy screening: Asthma: yes Rhino conjunctivitis: yes Food allergy: no Medication allergy:  Medication tolerance to Cipro and Trylita  Hymenoptera allergy: no Urticaria:  no Eczema:no History of recurrent infections suggestive of immunodeficency: no  Diagnostics: Spirometry:  Tracings reviewed. effort: Good reproducible efforts. FVC: 2.66 L (pre), 2.94L  (post) FEV1: 2.57 L, 101% predicted (pre), 2.51L, 99% predicted (post) FEV1/FVC ratio: 0.70 (pre), 78 (post) Interpretation: Spirometry consistent with normal pattern with no bronchodilator response  Skin Testing:  Deferred to [facility] insurance .  Results interpreted by myself and discussed with patient/family.   Past Medical History: Patient Active Problem List   Diagnosis Date Noted   Chronic rhinitis 01/02/2023   Conjunctivitis 01/02/2023   History of pulmonary embolism 01/02/2023   Mild persistent asthma without complication 09/60/4540   Anemia 01/21/2009   EOSINOPHILIA 01/21/2009   FEVER UNSPECIFIED 01/21/2009   Past Medical History:  Diagnosis Date   Diabetes ([place])    Eosinophilia hyper    Hx of migraines    Ligament tear    right arm at age 66   pulmonary embol    Past Surgical History: Past Surgical History:  Procedure Laterality Date   CESAREAN SECTION     CHOLECYSTECTOMY  2002   WISDOM TOOTH EXTRACTION     age 43   Medication List:  Current Outpatient  Medications  Medication Sig Dispense Refill   albuterol (VENTOLIN HFA) 108 (90 Base) MCG/ACT inhaler SMARTSIG:2 Puff(s) By Mouth Every 6 Hours PRN     aspirin 325 MG tablet Take 2 tablets (650 mg total) by mouth daily. (Patient taking differently: Take 325 mg by mouth daily.) 30 tablet 9   atorvastatin (LIPITOR) 10 MG tablet Take 10 mg by mouth daily.     BD PEN NEEDLE NANO U/F 32G X 4 MM MISC      clonazePAM (KLONOPIN) 0.5 MG tablet Take 0.5 mg by mouth at bedtime as needed for anxiety.     fluticasone (FLONASE) 50 MCG/ACT nasal spray Take 50 mcg by mouth as needed.     Lancets (ONETOUCH ULTRASOFT) lancets      LANTUS SOLOSTAR 100 UNIT/ML injection 100 mLs. 08/13/2019 80 units at bedtime     levocetirizine (XYZAL) 5 MG tablet  Take 5 mg by mouth every morning.     lisinopril-hydrochlorothiazide (ZESTORETIC) 20-25 MG tablet Take 1 tablet by mouth daily.     loratadine (CLARITIN) 10 MG tablet Take 10 mg by mouth at bedtime.     metFORMIN (GLUCOPHAGE) 500 MG tablet 1,000 mg 2 (two) times daily.     montelukast (SINGULAIR) 10 MG tablet Take 10 mg by mouth daily.     ONE TOUCH ULTRA TEST test strip      RYBELSUS 7 MG TABS Take 1 tablet by mouth daily.     sertraline (ZOLOFT) 50 MG tablet Take 50 mg by mouth daily.     Spacer/Aero-Holding Chambers (AEROCHAMBER PLUS WITH MASK) inhaler USE WITH ALBUTEROL AS DIRECTED     SUMAtriptan (IMITREX) 100 MG tablet Take 100 mg by mouth as needed.      Budesonide 90 MCG/ACT inhaler Inhale 1 puff into the lungs 2 (two) times daily. 1 each 5   No current facility-administered medications for this visit.   Facility-Administered Medications Ordered in Other Visits  Medication Dose Route Frequency Provider Last Rate Last Admin   0.9 %  sodium chloride infusion   Intravenous Once Erenest Blank, NP       Allergies: Allergies  Allergen Reactions   Ciprofloxacin Other (See Comments)    UNKNOWN REACTION   Social History: Social History   Socioeconomic History   Marital status: Married    Spouse name: Not on file   Number of children: Not on file   Years of education: Not on file   Highest education level: Not on file  Occupational History   Not on file  Tobacco Use   Smoking status: Never   Smokeless tobacco: Never   Tobacco comments:    never used tobacco  Vaping Use   Vaping Use: Never used  Substance and Sexual Activity   Alcohol use: Yes    Alcohol/week: 0.0 standard drinks of alcohol    Comment: occasional    Drug use: No   Sexual activity: Yes    Birth control/protection: I.U.D.  Other Topics Concern   Not on file  Social History Narrative   Not on file   Social Determinants of Health   Financial Resource Strain: Not on file  Food Insecurity: Not on  file  Transportation Needs: Not on file  Physical Activity: Not on file  Stress: Not on file  Social Connections: Not on file   Lives in a townhome that is [address] years old.  There is no roaches in the house but is to get off the floor.  There is no  dust mite precautions she is not exposed to fumes, chemicals or dust.  There is a HEPA filter in the bedroom.  She does not live near [employer] area. Smoking: No exposure Occupation: Currently a Freight forwarder in [employer], [employer] HistoryFreight forwarder in the house: no Charity fundraiser in the family room: no Carpet in the bedroom: no Heating: gas Cooling: central Pet: no  Family History: Family History  Problem Relation Age of Onset   Heart attack Father    Diabetes Father    Cancer Maternal Grandmother    Heart attack Paternal Grandfather        paranoid   Schizophrenia Other      ROS: All others negative except as noted per HPI.   Objective: BP 104/70 (BP Location: Left Arm, Patient Position: Sitting, Cuff Size: Large)   Pulse (!) 102   Temp 97.7 F (36.5 C) (Temporal)   Resp 16   Ht 5' 2.6" (1.59 m)   Wt 247 lb 4.8 oz (112.2 kg)   SpO2 99%   BMI 44.37 kg/m  Body mass index is 44.37 kg/m.  General Appearance:  Alert, cooperative, no distress, appears stated age  Head:  Normocephalic, without obvious abnormality, atraumatic  Eyes:  Conjunctiva clear, EOM's intact  Nose: Nares normal,  erythematous nasal mucosa , hypertrophic turbinates, no visible anterior polyps, and septum midline  Throat: Lips, tongue normal; teeth and gums normal, no tonsillar exudate and + cobblestoning  Neck: Supple, symmetrical  Lungs:   clear to auscultation bilaterally, Respirations unlabored, no coughing  Heart:  regular rate and rhythm and no murmur, Appears well perfused  Extremities: No edema  Skin: Skin color, texture, turgor normal, no rashes or lesions on visualized portions of skin  Neurologic: No gross deficits   The plan was  reviewed with the patient/family, and all questions/concerned were addressed.  It was my pleasure to see Margaret Gross today and participate in her care. Please feel free to contact me with any questions or concerns.  Sincerely,  Roney Marion, MD Allergy & Immunology  [facility] office: [phone] [facility] office: [phone]

## 2023-01-02 NOTE — Patient Instructions (Addendum)
Recurrent bronchitis: suspect underlying mild persistent asthma   - Breathing test today showed: looked okay, but based on symptoms we should start a controller therapy   PLAN:  - Spacer use reviewed. - Daily controller medication(s): Singulair 10mg  daily and Pulmicort Flexhaler 34mcg 1 puffs twice daily - Prior to physical activity: albuterol 2 puffs 10-15 minutes before physical activity. - Rescue medications: albuterol 4 puffs every 4-6 hours as needed - Changes during respiratory infections or worsening symptoms: Increase Pulmicort  to 3 puffs twice daily for TWO WEEKS. - Asthma control goals:  * Full participation in all desired activities (may need albuterol before activity) * Albuterol use two time or less a week on average (not counting use with activity) * Cough interfering with sleep two time or less a month * Oral steroids no more than once a year * No hospitalizations  Chronic  Rhinitis not well controlled: - allergy testing today was deferred due to [facility] insurance, will have patient come back for skin testing  - consider allergy shots as long term control of your symptoms by teaching your immune system to be more tolerant of your allergy triggers  -Information provided today to include standard and RUSH build up  - Start ryatris  2 sprays per nostril daily (sample provided) if you like we can prescribed this  - Start nasal sinus rinses 10-15 minutes prior to nose sprays  - Continue Singulair (Montelukast) 10mg  nightly. - Continue over the counter antihistamine daily or daily as needed.   -Your options include Zyrtec (Cetirizine) 10mg , Claritin (Loratadine) 10mg , Allegra (Fexofenadine) 180mg , or Xyzal (Levocetirinze) 5mg   Allergic Conjunctivitis:  - Consider Allergy Eye drops: great options include Pataday (Olopatadine) or Zaditor (ketotifen) for eye symptoms daily as needed-both sold over the counter if not covered by insurance.   -Avoid eye drops that say red eye relief as  they may contain medications that dry out your eyes.   HES: currently asymptomatic/resolved  - We will review all previous labs and hospitalizations  - get CBC today, if at goal plan for yearly monitoring   Follow up: 1 week for allergy skin testing to the environmentals   Thank you so much for letting me partake in your care today.  Don't hesitate to reach out if you have any additional concerns!  Roney Marion, MD  [facility] [place] [place]

## 2023-01-03 ENCOUNTER — Telehealth: Payer: Self-pay

## 2023-01-03 LAB — CBC WITH DIFFERENTIAL
Basophils Absolute: 0.1 10*3/uL (ref 0.0–0.2)
Basos: 1 %
EOS (ABSOLUTE): 0.6 10*3/uL — ABNORMAL HIGH (ref 0.0–0.4)
Eos: 7 %
Hematocrit: 41.1 % (ref 34.0–46.6)
Hemoglobin: 13.3 g/dL (ref 11.1–15.9)
Immature Grans (Abs): 0 10*3/uL (ref 0.0–0.1)
Immature Granulocytes: 0 %
Lymphocytes Absolute: 2.4 10*3/uL (ref 0.7–3.1)
Lymphs: 28 %
MCH: 27.3 pg (ref 26.6–33.0)
MCHC: 32.4 g/dL (ref 31.5–35.7)
MCV: 84 fL (ref 79–97)
Monocytes Absolute: 0.5 10*3/uL (ref 0.1–0.9)
Monocytes: 6 %
Neutrophils Absolute: 4.7 10*3/uL (ref 1.4–7.0)
Neutrophils: 58 %
RBC: 4.87 x10E6/uL (ref 3.77–5.28)
RDW: 13.2 % (ref 11.7–15.4)
WBC: 8.3 10*3/uL (ref 3.4–10.8)

## 2023-01-03 MED ORDER — ARNUITY ELLIPTA 100 MCG/ACT IN AEPB: INHALATION_SPRAY | RESPIRATORY_TRACT | 5 refills | Status: DC

## 2023-01-03 NOTE — Progress Notes (Signed)
Eosinophil level remains stable.  Can someone let patient know?

## 2023-01-03 NOTE — Telephone Encounter (Signed)
Spoke to the patient and we will send in arnuity ellipta 100 mcg 1 puff twice a day. Patient stated that any preventative medication that is covered by her insurance should be fully insured.

## 2023-01-05 MED ORDER — BUDESONIDE 90 MCG/ACT IN AEPB: 1.0000 | INHALATION_SPRAY | Freq: Two times a day (BID) | RESPIRATORY_TRACT | 5 refills | Status: DC

## 2023-01-10 ENCOUNTER — Ambulatory Visit (INDEPENDENT_AMBULATORY_CARE_PROVIDER_SITE_OTHER): Payer: 59 | Admitting: Internal Medicine

## 2023-01-10 ENCOUNTER — Encounter: Payer: Self-pay | Admitting: Internal Medicine

## 2023-01-10 VITALS — BP 110/72 | HR 97 | Temp 97.7°F | Resp 20

## 2023-01-10 DIAGNOSIS — J4530 Mild persistent asthma, uncomplicated: Secondary | ICD-10-CM | POA: Diagnosis not present

## 2023-01-10 DIAGNOSIS — J3089 Other allergic rhinitis: Secondary | ICD-10-CM | POA: Diagnosis not present

## 2023-01-10 DIAGNOSIS — J302 Other seasonal allergic rhinitis: Secondary | ICD-10-CM

## 2023-01-10 MED ORDER — ARNUITY ELLIPTA 100 MCG/ACT IN AEPB: 1.0000 | INHALATION_SPRAY | Freq: Two times a day (BID) | RESPIRATORY_TRACT | 1 refills | Status: DC

## 2023-01-10 NOTE — Patient Instructions (Signed)
Recurrent Bronchitis   PLAN:  - Spacer use reviewed. - Daily controller medication(s):  Arnuity 196mcg 1 puff twice daily  and Singulair 10mg  daily - Prior to physical activity: albuterol 2 puffs 10-15 minutes before physical activity. - Rescue medications: albuterol 4 puffs every 4-6 hours as needed - Changes during respiratory infections or worsening symptoms: Increase Arnuity  to 3 puffs twice daily for TWO WEEKS. - Asthma control goals:  * Full participation in all desired activities (may need albuterol before activity) * Albuterol use two time or less a week on average (not counting use with activity) * Cough interfering with sleep two time or less a month * Oral steroids no more than once a year * No hospitalizations  Chronic  Rhinitis: not well controlled  -Allergy testing today was positive to tree mix and molds -Start avoidance precautions -Consider allergy injections, we can discuss this more at your next follow-up - Continue  ryatris  2 sprays per nostril daily (sample provided) if you like we can prescribed this  - Continue nasal sinus rinses 10-15 minutes prior to nose sprays  - Continue Singulair (Montelukast) 10mg  nightly. - Continue over the counter antihistamine daily or daily as needed.   -Your options include Zyrtec (Cetirizine) 10mg , Claritin (Loratadine) 10mg , Allegra (Fexofenadine) 180mg , or Xyzal (Levocetirinze) 5mg   Allergic Conjunctivitis:  - Consider Allergy Eye drops: great options include Pataday (Olopatadine) or Zaditor (ketotifen) for eye symptoms daily as needed-both sold over the counter if not covered by insurance.   -Avoid eye drops that say red eye relief as they may contain medications that dry out your eyes.   HES: currently asymptomatic/resolved  - Labs are stable, we will continue to monitor   Follow up: 2 months   Thank you so much for letting me partake in your care today.  Don't hesitate to reach out if you have any additional  concerns!  Control of Mold Allergen   Mold and fungi can grow on a variety of surfaces provided certain temperature and moisture conditions exist.  Outdoor molds grow on plants, decaying vegetation and soil.  The major outdoor mold, Alternaria and Cladosporium, are found in very high numbers during hot and dry conditions.  Generally, a late Summer - Fall peak is seen for common outdoor fungal spores.  Rain will temporarily lower outdoor mold spore count, but counts rise rapidly when the rainy period ends.  The most important indoor molds are Aspergillus and Penicillium.  Dark, humid and poorly ventilated basements are ideal sites for mold growth.  The next most common sites of mold growth are the bathroom and the kitchen.  Indoor (Perennial) Mold Control   Positive indoor molds via skin testing: Aspergillus, Penicillium, Fusarium, Aureobasidium (Pullulara), and Rhizopus  Maintain humidity below 50%. Clean washable surfaces with 5% bleach solution. Remove sources e.g. contaminated carpets.  Reducing Pollen Exposure  The American Academy of Allergy, Asthma and Immunology suggests the following steps to reduce your exposure to pollen during allergy seasons.    Do not hang sheets or clothing out to dry; pollen may collect on these items. Do not mow lawns or spend time around freshly cut grass; mowing stirs up pollen. Keep windows closed at night.  Keep car windows closed while driving. Minimize morning activities outdoors, a time when pollen counts are usually at their highest. Stay indoors as much as possible when pollen counts or humidity is high and on windy days when pollen tends to remain in the air longer. Use air conditioning  when possible.  Many air conditioners have filters that trap the pollen spores. Use a HEPA room air filter to remove pollen form the indoor air you breathe.    Roney Marion, MD  [facility] [place], [place]

## 2023-01-10 NOTE — Progress Notes (Signed)
Date of Service/Encounter:  01/10/23  Allergy testing appointment   Initial visit on 01/02/23, seen for chronic rhinoconjucntivitis, HE'S, persistent asthma .  Please see that note for additional details.  Today reports for allergy diagnostic testing:    DIAGNOSTICS:  Skin Testing: Environmental allergy panel and select foods.  Epicutaneous testing:  negative to environmentals and select foods  Intradermal testing:  positive to tee mix, mold mix2 and mold mix4 Adequate positive and negative controls Results discussed with patient/family.  Airborne Adult Perc - 01/10/23 1003     Time Antigen Placed 1000    Allergen Manufacturer Greer    Location Back    Number of Test 59    1. Control-Buffer 50% Glycerol Negative    2. Control-Histamine 1 mg/ml 3+    3. Albumin saline Negative    4. [place] Negative    5. [place] Negative    6. Johnson Negative    7. Strawberry Blue Negative    8. Meadow Fescue Negative    9. Perennial Rye Negative    10. Sweet Vernal Negative    11. Timothy Negative    12. Cocklebur Negative    13. Burweed Marshelder Negative    14. Ragweed, short Negative    15. Ragweed, Giant Negative    16. Plantain,  English Negative    17. Lamb's Quarters Negative    18. Sheep Sorrell Negative    19. Rough Pigweed Negative    20. Marsh Elder, Rough Negative    21. Mugwort, Common Negative    22. Ash mix Negative    23. Birch mix Negative    24. Beech American Negative    25. Box, Elder Negative    26. Cedar, red Negative    27. Cottonwood, [place] Negative    28. Elm mix Negative    29. Hickory Negative    30. Maple mix Negative    31. Oak, [place] mix Negative    32. Pecan Pollen Negative    33. Pine mix Negative    34. Sycamore Eastern Negative    35. Hickory, Black Pollen Negative    36. Alternaria alternata Negative    37. Cladosporium Herbarum Negative    38. Aspergillus mix Negative    39. Penicillium mix Negative    40. Bipolaris sorokiniana  (Helminthosporium) Negative    41. Drechslera spicifera (Curvularia) Negative    42. Mucor plumbeus Negative    43. Fusarium moniliforme Negative    44. Aureobasidium pullulans (pullulara) Negative    45. Rhizopus oryzae Negative    46. Botrytis cinera Negative    47. Epicoccum nigrum Negative    48. Phoma betae Negative    49. Candida Albicans Negative    50. Trichophyton mentagrophytes Negative    51. Mite, D Farinae  5,000 AU/ml Negative    52. Mite, D Pteronyssinus  5,000 AU/ml Negative    53. Cat Hair 10,000 BAU/ml Negative    54.  Dog Epithelia Negative    55. Mixed Feathers Negative    56. Horse Epithelia Negative    57. Cockroach, German Negative    58. Mouse Negative    59. Tobacco Leaf Negative             Food Perc - 01/10/23 1003       Test Information   Time Antigen Placed 1000    Allergen Manufacturer Lavella Hammock    Location Back    Number of allergen test 10      Food  1. Peanut Negative    2. Soybean food Negative    3. Wheat, whole Negative    4. Sesame Negative    5. Milk, cow Negative    6. Egg White, chicken Negative    7. Casein Negative    8. Shellfish mix Negative    9. Fish mix Negative    10. Cashew Negative             Intradermal - 01/10/23 1034     Time Antigen Placed 1032    Allergen Manufacturer Lavella Hammock    Location Arm    Number of Test 15    Control Negative    Guatem[place] Negative    Johnson Negative    7 Grass Negative    Ragweed mix Negative    Weed mix Negative    Tree mix 3+    Mold 1 Negative    Mold 2 3+    Mold 3 Negative    Mold 4 3+    Cat Negative    Dog Negative    Cockroach Negative    Mite mix Negative             Allergy testing results were read and interpreted by myself, documented by clinical staff.  Patient provided with copy of allergy testing along with avoidance measures when indicated.   Roney Marion, MD  [facility] [facility] [place]

## 2023-01-11 ENCOUNTER — Telehealth: Payer: Self-pay

## 2023-01-11 NOTE — Telephone Encounter (Signed)
Pts insurance will only cover arnuity 1 puff daily please advice to change in therapy

## 2023-01-12 MED ORDER — ARNUITY ELLIPTA 100 MCG/ACT IN AEPB: 1.0000 | INHALATION_SPRAY | Freq: Every day | RESPIRATORY_TRACT | 1 refills | Status: DC

## 2023-01-12 NOTE — Telephone Encounter (Signed)
Ok, we can do 1 puff 168mcg daily  Thanks!

## 2023-01-12 NOTE — Telephone Encounter (Signed)
Sent in arnuity 1 puff daily to pharmacy

## 2023-01-12 NOTE — Addendum Note (Signed)
Addended by: Felipa Emory on: 01/12/2023 12:01 PM   Modules accepted: Orders

## 2023-01-31 ENCOUNTER — Telehealth: Payer: Self-pay | Admitting: Internal Medicine

## 2023-01-31 ENCOUNTER — Other Ambulatory Visit: Payer: Self-pay

## 2023-01-31 MED ORDER — ARNUITY ELLIPTA 100 MCG/ACT IN AEPB: 1.0000 | INHALATION_SPRAY | Freq: Every day | RESPIRATORY_TRACT | 1 refills | Status: DC

## 2023-01-31 NOTE — Telephone Encounter (Signed)
Pt states pharmacy is waiting on a response for refill of ARNUITY ELLIPTA it is [facility].

## 2023-01-31 NOTE — Telephone Encounter (Signed)
Called pt to find out which pharmacy she is referring to and which pharmacy she wants the refill to go to. LVM

## 2023-02-01 NOTE — Telephone Encounter (Signed)
Rx sent to [facility]

## 2023-03-12 ENCOUNTER — Ambulatory Visit (INDEPENDENT_AMBULATORY_CARE_PROVIDER_SITE_OTHER): Payer: 59 | Admitting: Internal Medicine

## 2023-03-12 ENCOUNTER — Encounter: Payer: Self-pay | Admitting: Internal Medicine

## 2023-03-12 VITALS — BP 106/66 | HR 95 | Temp 98.0°F | Resp 18 | Ht 63.0 in | Wt 250.1 lb

## 2023-03-12 DIAGNOSIS — H6501 Acute serous otitis media, right ear: Secondary | ICD-10-CM

## 2023-03-12 DIAGNOSIS — D72110 Idiopathic hypereosinophilic syndrome (ihes): Secondary | ICD-10-CM

## 2023-03-12 DIAGNOSIS — J3089 Other allergic rhinitis: Secondary | ICD-10-CM | POA: Diagnosis not present

## 2023-03-12 DIAGNOSIS — J302 Other seasonal allergic rhinitis: Secondary | ICD-10-CM

## 2023-03-12 DIAGNOSIS — J4530 Mild persistent asthma, uncomplicated: Secondary | ICD-10-CM | POA: Diagnosis not present

## 2023-03-12 MED ORDER — EPINEPHRINE 0.3 MG/0.3ML IJ SOAJ: 0.3000 mg | INTRAMUSCULAR | 1 refills | Status: DC | PRN

## 2023-03-12 MED ORDER — METHYLPREDNISOLONE ACETATE 40 MG/ML IJ SUSP
40.0000 mg | Freq: Once | INTRAMUSCULAR | Status: AC
  Administered 2023-03-12: 40 mg via INTRAMUSCULAR

## 2023-03-12 MED ORDER — ALBUTEROL SULFATE HFA 108 (90 BASE) MCG/ACT IN AERS: INHALATION_SPRAY | RESPIRATORY_TRACT | 2 refills | Status: DC

## 2023-03-12 MED ORDER — AZELASTINE-FLUTICASONE 137-50 MCG/ACT NA SUSP: 1.0000 | Freq: Two times a day (BID) | NASAL | 5 refills | Status: AC

## 2023-03-12 MED ORDER — AMOXICILLIN-POT CLAVULANATE 875-125 MG PO TABS: 1.0000 | ORAL_TABLET | Freq: Two times a day (BID) | ORAL | 0 refills | Status: AC

## 2023-03-12 MED ORDER — IPRATROPIUM BROMIDE 0.06 % NA SOLN: 2.0000 | Freq: Four times a day (QID) | NASAL | 12 refills | Status: AC

## 2023-03-12 MED ORDER — IPRATROPIUM BROMIDE 0.06 % NA SOLN: 2.0000 | Freq: Four times a day (QID) | NASAL | 12 refills | Status: DC

## 2023-03-12 MED ORDER — AZELASTINE-FLUTICASONE 137-50 MCG/ACT NA SUSP: 1.0000 | Freq: Two times a day (BID) | NASAL | 5 refills | Status: DC

## 2023-03-12 NOTE — Progress Notes (Signed)
Follow Up Note  RE: Margaret Gross MRN: [id number] DOB: [birth year] Date of Office Visit: 03/12/2023  Referring provider: Lennie Odor, PA Primary care provider: Lennie Odor, PA  Chief Complaint: Follow-up (Pt states she's been feeling the spring weather symptoms, drainage, congestion)  History of Present Illness: I had the pleasure of seeing Margaret Gross for a follow up visit at the [facility] [place] [facility]  on 03/16/2023. She is a 57 y.o. female, who is being followed for 01/02/23. Her previous allergy office visit was on 01/02/23. with Dr. Edison Pace. Today is a regular follow up visit.  History obtained from patient, chart review.  Today she reports Increase in post nasal drip, nasal congestion, sinus headache, ear fullness R> L, sneezing, itchy watery eyes.  She did not notice an improvement with ryaltris  Currently xyzal in AM, claritin in evening, montelukast at night.   She is on arnuity 116mcg 1 puff twice daily for asthma/recurrent bronchitis.  Denies any breakthrough cough, wheeze, chest tightness.  Has not needed any albuterol.  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 Hospitalized for work up, BMBx negative for eosinophilic leukemia.  Rx'd with high dose corticosteroids and f/b H/O fo 10 years.  Graduated lasted year.   -AEC 01/02/23: 600 (baseline)   Assessment and Plan: [name] is a 57 y.o. female with: Seasonal and perennial allergic rhinitis  Mild persistent asthma without complication - Plan: Spirometry with Graph  Idiopathic hypereosinophilic syndrome  Non-recurrent acute serous otitis media of right ear   Plan: Patient Instructions  Acute otitis media with acute sinus  infection  - Start augmentin 875/125mg  twice daily for 10 days  - Depo-Medrol 40mg  IM given today    Recurrent Bronchitis: well controlled  Breathing tests today showed:  looked great!  PLAN:  - Spacer use reviewed. - Daily controller medication(s):  Arnuity 114mcg 1 puff twice daily  and Singulair 10mg  daily - Prior to physical activity: albuterol 2 puffs 10-15 minutes before physical activity. - Rescue medications: albuterol 4 puffs every 4-6 hours as needed - Changes during respiratory infections or worsening symptoms: Increase Arnuity  to 3 puffs twice daily for TWO WEEKS. - Asthma control goals:  * Full participation in all desired activities (may need albuterol before activity) * Albuterol use two time or less a week on average (not counting use with activity) * Cough interfering with sleep two time or less a month * Oral steroids no more than once a year * No hospitalizations  Chronic  Rhinitis: not well controlled  - Continue avoidance measures to tree pollen and mold  - Plan to start allergy injections with RUSH protocol  - START dyimista 1 spray per nostril twice daily  (use to prevent and control symptoms)  - START atrovent 1 spray per nostril up to 4 times a day for drainage  - Continue nasal sinus rinses 10-15 minutes prior to nose sprays  - Continue Singulair (Montelukast) 10mg  nightly. - Continue over the counter antihistamine daily or daily as needed.   -Your options include Zyrtec (Cetirizine) 10mg , Claritin (Loratadine) 10mg , Allegra (Fexofenadine) 180mg , or Xyzal (Levocetirinze) 5mg   Allergic Conjunctivitis:  - Consider Allergy Eye drops: great options  include Pataday (Olopatadine) or Zaditor (ketotifen) for eye symptoms daily as needed-both sold over the counter if not covered by insurance.   -Avoid eye drops that say red eye relief as they may contain medications that dry out your eyes.   HES: currently asymptomatic/resolved  - Labs are stable, we will  continue to monitor   Follow up: for RUSH protocol   Thank you so much for letting me partake in your care today.  Don't hesitate to reach out if you have any additional concerns!  Roney Marion, MD  [facility] [place] [place]  Start allergy injections. Had a detailed discussion with patient/family that clinical history is suggestive of allergic rhinitis, and may benefit from allergy immunotherapy (AIT). Discussed in detail regarding the dosing, schedule, side effects (mild to moderate local allergic reaction and rarely systemic allergic reactions including anaphylaxis/death), alternatives and benefits (significant improvement in nasal symptoms, seasonal flares of asthma) of immunotherapy with the patient. There is significant time commitment involved with allergy shots, which includes weekly immunotherapy injections for first 9-12 months and then biweekly to monthly injections for 3-5 years. Clinical response is often delayed and patient may not see an improvement for 6-12 months. Consent was signed. I have prescribed epinephrine injectable and demonstrated proper use. For mild symptoms you can take over the counter antihistamines such as Benadryl and monitor symptoms closely. If symptoms worsen or if you have severe symptoms including breathing issues, throat closure, significant swelling, whole body hives, severe diarrhea and vomiting, lightheadedness then inject epinephrine and seek immediate medical care afterwards. Action plan given.     Meds ordered this encounter  Medications   DISCONTD: albuterol (VENTOLIN HFA) 108 (90 Base) MCG/ACT inhaler    Sig: SMARTSIG:2 Puff(s) By Mouth Every 6 Hours PRN    Dispense:  8 g    Refill:  2   DISCONTD: ipratropium (ATROVENT) 0.06 % nasal spray    Sig: Place 2 sprays into both nostrils 4 (four) times daily.    Dispense:  15 mL    Refill:  12   DISCONTD: Azelastine-Fluticasone 137-50 MCG/ACT SUSP    Sig: Place 1 spray into the  nose in the morning and at bedtime.    Dispense:  23 g    Refill:  5   DISCONTD: EPINEPHrine 0.3 mg/0.3 mL IJ SOAJ injection    Sig: Inject 0.3 mg into the muscle as needed for anaphylaxis.    Dispense:  1 each    Refill:  1   ipratropium (ATROVENT) 0.06 % nasal spray    Sig: Place 2 sprays into both nostrils 4 (four) times daily.    Dispense:  15 mL    Refill:  12   EPINEPHrine 0.3 mg/0.3 mL IJ SOAJ injection    Sig: Inject 0.3 mg into the muscle as needed for anaphylaxis.    Dispense:  1 each    Refill:  1   Azelastine-Fluticasone 137-50 MCG/ACT SUSP    Sig: Place 1 spray into the nose in the morning and at bedtime.    Dispense:  23 g    Refill:  5   DISCONTD: albuterol (VENTOLIN HFA) 108 (90 Base) MCG/ACT inhaler    Sig: SMARTSIG:2 Puff(s) By Mouth Every 6 Hours PRN    Dispense:  8 g    Refill:  2   amoxicillin-clavulanate (AUGMENTIN) 875-125 MG tablet    Sig: Take 1 tablet by mouth 2 (two) times daily.    Dispense:  20 tablet  Refill:  0   methylPREDNISolone acetate (DEPO-MEDROL) injection 40 mg    Lab Orders  No laboratory test(s) ordered today   Diagnostics: Spirometry:  Tracings reviewed. Her effort: Good reproducible efforts. FVC: 2.96 L FEV1: 2.22 L, 87% predicted FEV1/FVC ratio: 75% Interpretation: Spirometry consistent with normal pattern.  Please see scanned spirometry results for details.   Results interpreted by myself during this encounter and discussed with patient/family.   Medication List:  Current Outpatient Medications  Medication Sig Dispense Refill   amoxicillin-clavulanate (AUGMENTIN) 875-125 MG tablet Take 1 tablet by mouth 2 (two) times daily. 20 tablet 0   aspirin 325 MG tablet Take 2 tablets (650 mg total) by mouth daily. (Patient taking differently: Take 325 mg by mouth daily.) 30 tablet 9   atorvastatin (LIPITOR) 10 MG tablet Take 10 mg by mouth daily.     BD PEN NEEDLE NANO U/F 32G X 4 MM MISC      clonazePAM (KLONOPIN) 0.5 MG  tablet Take 0.5 mg by mouth at bedtime as needed for anxiety.     fluticasone (FLONASE) 50 MCG/ACT nasal spray Take 50 mcg by mouth as needed.     Fluticasone Furoate (ARNUITY ELLIPTA) 100 MCG/ACT AEPB Inhale 1 puff into the lungs daily. 180 each 1   Lancets (ONETOUCH ULTRASOFT) lancets      LANTUS SOLOSTAR 100 UNIT/ML injection 100 mLs. 08/13/2019 80 units at bedtime     levocetirizine (XYZAL) 5 MG tablet Take 5 mg by mouth every morning.     lisinopril-hydrochlorothiazide (ZESTORETIC) 20-25 MG tablet Take 1 tablet by mouth daily.     loratadine (CLARITIN) 10 MG tablet Take 10 mg by mouth at bedtime.     metFORMIN (GLUCOPHAGE) 500 MG tablet 1,000 mg 2 (two) times daily.     montelukast (SINGULAIR) 10 MG tablet Take 10 mg by mouth daily.     ONE TOUCH ULTRA TEST test strip      RYBELSUS 7 MG TABS Take 1 tablet by mouth daily.     sertraline (ZOLOFT) 50 MG tablet Take 50 mg by mouth daily.     Spacer/Aero-Holding Chambers (AEROCHAMBER PLUS WITH MASK) inhaler USE WITH ALBUTEROL AS DIRECTED     SUMAtriptan (IMITREX) 100 MG tablet Take 100 mg by mouth as needed.      albuterol (VENTOLIN HFA) 108 (90 Base) MCG/ACT inhaler SMARTSIG:2 Puff(s) By Mouth Every 6 Hours PRN 8 g 1   Azelastine-Fluticasone 137-50 MCG/ACT SUSP Place 1 spray into the nose in the morning and at bedtime. 23 g 5   EPINEPHrine 0.3 mg/0.3 mL IJ SOAJ injection Inject 0.3 mg into the muscle as needed for anaphylaxis. 1 each 1   ipratropium (ATROVENT) 0.06 % nasal spray Place 2 sprays into both nostrils 4 (four) times daily. 15 mL 12   No current facility-administered medications for this visit.   Facility-Administered Medications Ordered in Other Visits  Medication Dose Route Frequency Provider Last Rate Last Admin   0.9 %  sodium chloride infusion   Intravenous Once Celso Amy, NP       Allergies: Allergies  Allergen Reactions   Ciprofloxacin Other (See Comments)    UNKNOWN REACTION   I reviewed her past medical  history, social history, family history, and environmental history and no significant changes have been reported from her previous visit.  ROS: All others negative except as noted per HPI.   Objective: BP 106/66   Pulse 95   Temp 98 F (36.7 C) (Temporal)   Resp  18   Ht 5\' 3"  (1.6 m)   Wt 250 lb 1.6 oz (113.4 kg)   SpO2 98%   BMI 44.30 kg/m  Body mass index is 44.3 kg/m. General Appearance:  Alert, cooperative, no distress, appears stated age  Head:  Normocephalic, without obvious abnormality, atraumatic  Eyes:  Conjunctiva clear, EOM's intact  EARS/Nose: Nares normal, right ear with effusion  erythematous and edematous nasal mucosa, hypertrophic turbinates, no visible anterior polyps, and septum midline  Throat: Lips, tongue normal; teeth and gums normal, + cobblestoning  Neck: Supple, symmetrical  Lungs:   clear to auscultation bilaterally, Respirations unlabored, no coughing  Heart:  regular rate and rhythm and no murmur, Appears well perfused  Extremities: No edema  Skin: Skin color, texture, turgor normal, no rashes or lesions on visualized portions of skin  Neurologic: No gross deficits   Previous notes and tests were reviewed. The plan was reviewed with the patient/family, and all questions/concerned were addressed.  It was my pleasure to see Adaline today and participate in her care. Please feel free to contact me with any questions or concerns.  Sincerely,  Roney Marion, MD  Allergy & Immunology  [facility] [place] [facility] Office: [phone]

## 2023-03-12 NOTE — Patient Instructions (Addendum)
Acute otitis media with acute sinus infection  - Start augmentin 875/125mg  twice daily for 10 days  - Depo-Medrol 40mg  IM given today    Recurrent Bronchitis: well controlled  Breathing tests today showed:  looked great!  PLAN:  - Spacer use reviewed. - Daily controller medication(s):  Arnuity 174mcg 1 puff twice daily  and Singulair 10mg  daily - Prior to physical activity: albuterol 2 puffs 10-15 minutes before physical activity. - Rescue medications: albuterol 4 puffs every 4-6 hours as needed - Changes during respiratory infections or worsening symptoms: Increase Arnuity  to 3 puffs twice daily for TWO WEEKS. - Asthma control goals:  * Full participation in all desired activities (may need albuterol before activity) * Albuterol use two time or less a week on average (not counting use with activity) * Cough interfering with sleep two time or less a month * Oral steroids no more than once a year * No hospitalizations  Chronic  Rhinitis: not well controlled  - Continue avoidance measures to tree pollen and mold  - Plan to start allergy injections with RUSH protocol  - START dyimista 1 spray per nostril twice daily  (use to prevent and control symptoms)  - START atrovent 1 spray per nostril up to 4 times a day for drainage  - Continue nasal sinus rinses 10-15 minutes prior to nose sprays  - Continue Singulair (Montelukast) 10mg  nightly. - Continue over the counter antihistamine daily or daily as needed.   -Your options include Zyrtec (Cetirizine) 10mg , Claritin (Loratadine) 10mg , Allegra (Fexofenadine) 180mg , or Xyzal (Levocetirinze) 5mg   Allergic Conjunctivitis:  - Consider Allergy Eye drops: great options include Pataday (Olopatadine) or Zaditor (ketotifen) for eye symptoms daily as needed-both sold over the counter if not covered by insurance.   -Avoid eye drops that say red eye relief as they may contain medications that dry out your eyes.   HES: currently asymptomatic/resolved   - Labs are stable, we will continue to monitor   Follow up: for RUSH protocol   Thank you so much for letting me partake in your care today.  Don't hesitate to reach out if you have any additional concerns!  Roney Marion, MD  [facility] [place] [place]  Start allergy injections. Had a detailed discussion with patient/family that clinical history is suggestive of allergic rhinitis, and may benefit from allergy immunotherapy (AIT). Discussed in detail regarding the dosing, schedule, side effects (mild to moderate local allergic reaction and rarely systemic allergic reactions including anaphylaxis/death), alternatives and benefits (significant improvement in nasal symptoms, seasonal flares of asthma) of immunotherapy with the patient. There is significant time commitment involved with allergy shots, which includes weekly immunotherapy injections for first 9-12 months and then biweekly to monthly injections for 3-5 years. Clinical response is often delayed and patient may not see an improvement for 6-12 months. Consent was signed. I have prescribed epinephrine injectable and demonstrated proper use. For mild symptoms you can take over the counter antihistamines such as Benadryl and monitor symptoms closely. If symptoms worsen or if you have severe symptoms including breathing issues, throat closure, significant swelling, whole body hives, severe diarrhea and vomiting, lightheadedness then inject epinephrine and seek immediate medical care afterwards. Action plan given.

## 2023-03-15 ENCOUNTER — Other Ambulatory Visit: Payer: Self-pay

## 2023-03-15 ENCOUNTER — Telehealth: Payer: Self-pay

## 2023-03-15 MED ORDER — ALBUTEROL SULFATE HFA 108 (90 BASE) MCG/ACT IN AERS: INHALATION_SPRAY | RESPIRATORY_TRACT | 1 refills | Status: DC

## 2023-03-15 NOTE — Telephone Encounter (Signed)
Sent in albuterol ventolin hfa with 1 refill to [facility].

## 2023-03-15 NOTE — Telephone Encounter (Signed)
Spoke to linda at [facility]. Will fill the albuterol hfa with 1 refill that is covered by her insurance.

## 2023-03-16 DIAGNOSIS — H6501 Acute serous otitis media, right ear: Secondary | ICD-10-CM | POA: Insufficient documentation

## 2023-03-16 DIAGNOSIS — J302 Other seasonal allergic rhinitis: Secondary | ICD-10-CM | POA: Insufficient documentation

## 2023-03-23 ENCOUNTER — Other Ambulatory Visit: Payer: Self-pay | Admitting: Internal Medicine

## 2023-03-23 DIAGNOSIS — J302 Other seasonal allergic rhinitis: Secondary | ICD-10-CM

## 2023-03-26 DIAGNOSIS — J302 Other seasonal allergic rhinitis: Secondary | ICD-10-CM

## 2023-03-26 NOTE — Progress Notes (Signed)
VIALS EXP 03-25-24 

## 2023-03-26 NOTE — Progress Notes (Signed)
Aeroallergen Immunotherapy   Ordering Provider: Dr. Ferol Luz   Patient Details  Name: Margaret Gross  MRN: [id number]  Date of Birth: 06-06-1966   Order 1 of 1   Vial Label: T-M   0.5 ml (Volume)  1:20 Concentration -- Eastern 10 Tree Mix (also Sweet Gum)  0.2 ml (Volume)  1:10 Concentration -- Aspergillus mix  0.2 ml (Volume)  1:10 Concentration -- Penicillium mix  0.2 ml (Volume)  1:10 Concentration -- Fusarium moniliforme  0.2 ml (Volume)  1:40 Concentration -- Aureobasidium pullulans  0.2 ml (Volume)  1:10 Concentration -- Rhizopus oryzae    1.5  ml Extract Subtotal  3.5  ml Diluent  5.0  ml Maintenance Total   Schedule:  RUSH  Silver Vial (1:1,000,000): RUSH  Blue Vial (1:100,000): RUSH  Yellow Vial (1:10,000): RUSH  Green Vial (1:1,000): Schedule B (6 doses)  Red Vial (1:100): Schedule A (10 doses)   Special Instructions: RUSH

## 2023-03-30 ENCOUNTER — Other Ambulatory Visit: Payer: Self-pay

## 2023-03-30 MED ORDER — PREDNISONE 20 MG PO TABS: ORAL_TABLET | ORAL | 0 refills | Status: AC

## 2023-03-30 MED ORDER — FAMOTIDINE 20 MG PO TABS: ORAL_TABLET | ORAL | 0 refills | Status: AC

## 2023-04-03 ENCOUNTER — Ambulatory Visit (INDEPENDENT_AMBULATORY_CARE_PROVIDER_SITE_OTHER): Payer: 59 | Admitting: Internal Medicine

## 2023-04-03 VITALS — BP 120/70 | HR 95 | Temp 97.7°F | Resp 17

## 2023-04-03 DIAGNOSIS — J3089 Other allergic rhinitis: Secondary | ICD-10-CM

## 2023-04-03 DIAGNOSIS — J302 Other seasonal allergic rhinitis: Secondary | ICD-10-CM

## 2023-04-03 NOTE — Progress Notes (Unsigned)
RAPID DESENSITIZATION Note  RE: Margaret Gross MRN: [id number] DOB: 09/29/1966 Date of Office Visit: 04/03/2023  Subjective:  Patient presents today for rapid desensitization.  Interval History: Patient has not been ill, she has taken all premedications as per protocol.  Recent/Current History: Pulmonary disease: no Cardiac disease: no Respiratory infection: no Rash: no Itch: no Swelling: no Cough: no Shortness of breath: no Runny/stuffy nose: no Itchy eyes: no Beta-blocker use: no  Patient/guardian was informed of the procedure with verbalized understanding of the risk of anaphylaxis. Consent has been signed.   Medication List:  Current Outpatient Medications  Medication Sig Dispense Refill   albuterol (VENTOLIN HFA) 108 (90 Base) MCG/ACT inhaler SMARTSIG:2 Puff(s) By Mouth Every 6 Hours PRN 8 g 1   amoxicillin-clavulanate (AUGMENTIN) 875-125 MG tablet Take 1 tablet by mouth 2 (two) times daily. 20 tablet 0   aspirin 325 MG tablet Take 2 tablets (650 mg total) by mouth daily. (Patient taking differently: Take 325 mg by mouth daily.) 30 tablet 9   atorvastatin (LIPITOR) 10 MG tablet Take 10 mg by mouth daily.     Azelastine-Fluticasone 137-50 MCG/ACT SUSP Place 1 spray into the nose in the morning and at bedtime. 23 g 5   BD PEN NEEDLE NANO U/F 32G X 4 MM MISC      clonazePAM (KLONOPIN) 0.5 MG tablet Take 0.5 mg by mouth at bedtime as needed for anxiety.     EPINEPHrine 0.3 mg/0.3 mL IJ SOAJ injection Inject 0.3 mg into the muscle as needed for anaphylaxis. 1 each 1   famotidine (PEPCID) 20 MG tablet Take 1 tab twice daily day before and day of RUSH appt. 4 tablet 0   fluticasone (FLONASE) 50 MCG/ACT nasal spray Take 50 mcg by mouth as needed.     Fluticasone Furoate (ARNUITY ELLIPTA) 100 MCG/ACT AEPB Inhale 1 puff into the lungs daily. 180 each 1   ipratropium (ATROVENT) 0.06 % nasal spray Place 2 sprays into both nostrils 4 (four) times daily. 15 mL 12   Lancets  (ONETOUCH ULTRASOFT) lancets      LANTUS SOLOSTAR 100 UNIT/ML injection 100 mLs. 08/13/2019 80 units at bedtime     levocetirizine (XYZAL) 5 MG tablet Take 5 mg by mouth every morning.     lisinopril-hydrochlorothiazide (ZESTORETIC) 20-25 MG tablet Take 1 tablet by mouth daily.     loratadine (CLARITIN) 10 MG tablet Take 10 mg by mouth at bedtime.     metFORMIN (GLUCOPHAGE) 500 MG tablet 1,000 mg 2 (two) times daily.     montelukast (SINGULAIR) 10 MG tablet Take 10 mg by mouth daily.     ONE TOUCH ULTRA TEST test strip      predniSONE (DELTASONE) 20 MG tablet Take 2 tabs morning day before & morning of RUSH appt. 4 tablet 0   RYBELSUS 7 MG TABS Take 1 tablet by mouth daily.     sertraline (ZOLOFT) 50 MG tablet Take 50 mg by mouth daily.     Spacer/Aero-Holding Chambers (AEROCHAMBER PLUS WITH MASK) inhaler USE WITH ALBUTEROL AS DIRECTED     SUMAtriptan (IMITREX) 100 MG tablet Take 100 mg by mouth as needed.      No current facility-administered medications for this visit.   Facility-Administered Medications Ordered in Other Visits  Medication Dose Route Frequency Provider Last Rate Last Admin   0.9 %  sodium chloride infusion   Intravenous Once Erenest Blank, NP       Allergies: Allergies  Allergen Reactions  Ciprofloxacin Other (See Comments)    UNKNOWN REACTION   I reviewed her past medical history, social history, family history, and environmental history and no significant changes have been reported from her previous visit.  ROS: Negative except as per HPI.  Objective: There were no vitals taken for this visit. There is no height or weight on file to calculate BMI.   General Appearance:  Alert, cooperative, no distress, appears stated age  Head:  Normocephalic, without obvious abnormality, atraumatic  Eyes:  Conjunctiva clear, EOM's intact  Nose: Nares normal  Throat: Lips, tongue normal; teeth and gums normal, noraml posterior oropharnyx  Neck: Supple, symmetrical   Lungs:   CTAB, Respirations unlabored, no coughing  Heart:  Appears well perfused  Extremities: No edema  Skin: Skin color, texture, turgor normal, no rashes or lesions on visualized portions of skin  Neurologic: No gross deficits     Diagnostics:  PROCEDURES:  Patient received the following doses every hour: Step 1:  0.59ml - 1:1,000,000 dilution (silver vial) Step 2:  0.5ml - 1:1,000,000 dilution (silver vial) Step 3: 0.32ml - 1:100,000 dilution (blue vial)  Step 4: 0.79ml - 1:100,000 dilution (blue vial)  Step 5: 0.57ml - 1:10,000 dilution (gold vial) Step 6: 0.90ml - 1:10,000 dilution (gold vial) Step 7: 0.20ml - 1:10,000 dilution (gold vial) Step 8: 0.53ml - 1:10,000 dilution (gold vial)  Patient was observed for 1 hour after the last dose.   Procedure started at *** Procedure ended at ***   ASSESSMENT/PLAN:   Patient has tolerated the rapid desensitization protocol.  Next appointment: Start at 0.82ml of 1:1000 dilution (green vial) and build up per protocol.

## 2023-04-09 ENCOUNTER — Ambulatory Visit (INDEPENDENT_AMBULATORY_CARE_PROVIDER_SITE_OTHER): Payer: 59

## 2023-04-09 DIAGNOSIS — J309 Allergic rhinitis, unspecified: Secondary | ICD-10-CM | POA: Diagnosis not present

## 2023-04-16 ENCOUNTER — Ambulatory Visit (INDEPENDENT_AMBULATORY_CARE_PROVIDER_SITE_OTHER): Payer: 59

## 2023-04-16 DIAGNOSIS — J309 Allergic rhinitis, unspecified: Secondary | ICD-10-CM | POA: Diagnosis not present

## 2023-04-23 ENCOUNTER — Ambulatory Visit (INDEPENDENT_AMBULATORY_CARE_PROVIDER_SITE_OTHER): Payer: 59

## 2023-04-23 DIAGNOSIS — J309 Allergic rhinitis, unspecified: Secondary | ICD-10-CM

## 2023-04-30 ENCOUNTER — Ambulatory Visit (INDEPENDENT_AMBULATORY_CARE_PROVIDER_SITE_OTHER): Payer: 59

## 2023-04-30 DIAGNOSIS — J309 Allergic rhinitis, unspecified: Secondary | ICD-10-CM

## 2023-05-07 ENCOUNTER — Ambulatory Visit (INDEPENDENT_AMBULATORY_CARE_PROVIDER_SITE_OTHER): Payer: 59

## 2023-05-07 DIAGNOSIS — J309 Allergic rhinitis, unspecified: Secondary | ICD-10-CM | POA: Diagnosis not present

## 2023-05-15 ENCOUNTER — Ambulatory Visit (INDEPENDENT_AMBULATORY_CARE_PROVIDER_SITE_OTHER): Payer: 59

## 2023-05-15 DIAGNOSIS — J309 Allergic rhinitis, unspecified: Secondary | ICD-10-CM | POA: Diagnosis not present

## 2023-05-21 ENCOUNTER — Ambulatory Visit (INDEPENDENT_AMBULATORY_CARE_PROVIDER_SITE_OTHER): Payer: 59

## 2023-05-21 DIAGNOSIS — J309 Allergic rhinitis, unspecified: Secondary | ICD-10-CM | POA: Diagnosis not present

## 2023-05-28 ENCOUNTER — Ambulatory Visit (INDEPENDENT_AMBULATORY_CARE_PROVIDER_SITE_OTHER): Payer: 59

## 2023-05-28 DIAGNOSIS — J309 Allergic rhinitis, unspecified: Secondary | ICD-10-CM | POA: Diagnosis not present

## 2023-06-04 ENCOUNTER — Ambulatory Visit (INDEPENDENT_AMBULATORY_CARE_PROVIDER_SITE_OTHER): Payer: 59

## 2023-06-04 DIAGNOSIS — J309 Allergic rhinitis, unspecified: Secondary | ICD-10-CM | POA: Diagnosis not present

## 2023-06-11 ENCOUNTER — Ambulatory Visit: Payer: Self-pay

## 2023-06-11 ENCOUNTER — Ambulatory Visit (INDEPENDENT_AMBULATORY_CARE_PROVIDER_SITE_OTHER): Payer: 59

## 2023-06-11 DIAGNOSIS — J309 Allergic rhinitis, unspecified: Secondary | ICD-10-CM

## 2023-06-18 ENCOUNTER — Ambulatory Visit (INDEPENDENT_AMBULATORY_CARE_PROVIDER_SITE_OTHER): Payer: 59

## 2023-06-18 DIAGNOSIS — J309 Allergic rhinitis, unspecified: Secondary | ICD-10-CM

## 2023-06-21 ENCOUNTER — Other Ambulatory Visit: Payer: Self-pay | Admitting: Internal Medicine

## 2023-06-25 ENCOUNTER — Ambulatory Visit (INDEPENDENT_AMBULATORY_CARE_PROVIDER_SITE_OTHER): Payer: 59

## 2023-06-25 DIAGNOSIS — J309 Allergic rhinitis, unspecified: Secondary | ICD-10-CM | POA: Diagnosis not present

## 2023-07-02 ENCOUNTER — Ambulatory Visit (INDEPENDENT_AMBULATORY_CARE_PROVIDER_SITE_OTHER): Payer: 59

## 2023-07-02 DIAGNOSIS — J309 Allergic rhinitis, unspecified: Secondary | ICD-10-CM

## 2023-07-09 ENCOUNTER — Ambulatory Visit (INDEPENDENT_AMBULATORY_CARE_PROVIDER_SITE_OTHER): Payer: 59

## 2023-07-09 DIAGNOSIS — J309 Allergic rhinitis, unspecified: Secondary | ICD-10-CM | POA: Diagnosis not present

## 2023-07-23 ENCOUNTER — Ambulatory Visit (INDEPENDENT_AMBULATORY_CARE_PROVIDER_SITE_OTHER): Payer: 59

## 2023-07-23 DIAGNOSIS — J309 Allergic rhinitis, unspecified: Secondary | ICD-10-CM

## 2023-07-30 ENCOUNTER — Ambulatory Visit (INDEPENDENT_AMBULATORY_CARE_PROVIDER_SITE_OTHER): Payer: 59

## 2023-07-30 DIAGNOSIS — J309 Allergic rhinitis, unspecified: Secondary | ICD-10-CM

## 2023-07-30 NOTE — Progress Notes (Signed)
Vials Exp 07/29/24

## 2023-07-31 DIAGNOSIS — J302 Other seasonal allergic rhinitis: Secondary | ICD-10-CM

## 2023-08-06 ENCOUNTER — Ambulatory Visit (INDEPENDENT_AMBULATORY_CARE_PROVIDER_SITE_OTHER): Payer: 59

## 2023-08-06 DIAGNOSIS — J309 Allergic rhinitis, unspecified: Secondary | ICD-10-CM | POA: Diagnosis not present

## 2023-08-09 ENCOUNTER — Other Ambulatory Visit: Payer: Self-pay | Admitting: Internal Medicine

## 2023-08-13 ENCOUNTER — Ambulatory Visit (INDEPENDENT_AMBULATORY_CARE_PROVIDER_SITE_OTHER): Payer: 59

## 2023-08-13 DIAGNOSIS — J309 Allergic rhinitis, unspecified: Secondary | ICD-10-CM | POA: Diagnosis not present

## 2023-08-22 ENCOUNTER — Ambulatory Visit (INDEPENDENT_AMBULATORY_CARE_PROVIDER_SITE_OTHER): Payer: 59

## 2023-08-22 DIAGNOSIS — J309 Allergic rhinitis, unspecified: Secondary | ICD-10-CM

## 2023-08-27 ENCOUNTER — Ambulatory Visit (INDEPENDENT_AMBULATORY_CARE_PROVIDER_SITE_OTHER): Payer: 59

## 2023-08-27 DIAGNOSIS — J309 Allergic rhinitis, unspecified: Secondary | ICD-10-CM | POA: Diagnosis not present

## 2023-09-03 ENCOUNTER — Ambulatory Visit (INDEPENDENT_AMBULATORY_CARE_PROVIDER_SITE_OTHER): Payer: 59

## 2023-09-03 DIAGNOSIS — J309 Allergic rhinitis, unspecified: Secondary | ICD-10-CM

## 2023-09-10 ENCOUNTER — Ambulatory Visit (INDEPENDENT_AMBULATORY_CARE_PROVIDER_SITE_OTHER): Payer: 59

## 2023-09-10 DIAGNOSIS — J309 Allergic rhinitis, unspecified: Secondary | ICD-10-CM

## 2023-09-18 ENCOUNTER — Ambulatory Visit (INDEPENDENT_AMBULATORY_CARE_PROVIDER_SITE_OTHER): Payer: 59

## 2023-09-18 DIAGNOSIS — J309 Allergic rhinitis, unspecified: Secondary | ICD-10-CM | POA: Diagnosis not present

## 2023-10-01 ENCOUNTER — Ambulatory Visit (INDEPENDENT_AMBULATORY_CARE_PROVIDER_SITE_OTHER): Payer: 59

## 2023-10-01 DIAGNOSIS — J309 Allergic rhinitis, unspecified: Secondary | ICD-10-CM

## 2023-10-15 ENCOUNTER — Ambulatory Visit (INDEPENDENT_AMBULATORY_CARE_PROVIDER_SITE_OTHER): Payer: 59

## 2023-10-15 DIAGNOSIS — J309 Allergic rhinitis, unspecified: Secondary | ICD-10-CM

## 2023-10-29 ENCOUNTER — Ambulatory Visit (INDEPENDENT_AMBULATORY_CARE_PROVIDER_SITE_OTHER): Payer: 59

## 2023-10-29 DIAGNOSIS — J309 Allergic rhinitis, unspecified: Secondary | ICD-10-CM | POA: Diagnosis not present

## 2023-10-31 DIAGNOSIS — J302 Other seasonal allergic rhinitis: Secondary | ICD-10-CM | POA: Diagnosis not present

## 2023-10-31 NOTE — Progress Notes (Signed)
VIAL EXP 10-30-24

## 2023-11-13 ENCOUNTER — Ambulatory Visit (INDEPENDENT_AMBULATORY_CARE_PROVIDER_SITE_OTHER): Payer: 59

## 2023-11-13 DIAGNOSIS — J309 Allergic rhinitis, unspecified: Secondary | ICD-10-CM

## 2023-11-27 ENCOUNTER — Ambulatory Visit (INDEPENDENT_AMBULATORY_CARE_PROVIDER_SITE_OTHER): Payer: 59

## 2023-11-27 DIAGNOSIS — J309 Allergic rhinitis, unspecified: Secondary | ICD-10-CM

## 2023-12-11 ENCOUNTER — Ambulatory Visit (INDEPENDENT_AMBULATORY_CARE_PROVIDER_SITE_OTHER): Payer: 59

## 2023-12-11 DIAGNOSIS — J309 Allergic rhinitis, unspecified: Secondary | ICD-10-CM

## 2023-12-17 ENCOUNTER — Ambulatory Visit (INDEPENDENT_AMBULATORY_CARE_PROVIDER_SITE_OTHER): Payer: 59

## 2023-12-17 DIAGNOSIS — J309 Allergic rhinitis, unspecified: Secondary | ICD-10-CM | POA: Diagnosis not present

## 2023-12-26 ENCOUNTER — Ambulatory Visit (INDEPENDENT_AMBULATORY_CARE_PROVIDER_SITE_OTHER): Payer: 59

## 2023-12-26 DIAGNOSIS — J309 Allergic rhinitis, unspecified: Secondary | ICD-10-CM | POA: Diagnosis not present

## 2024-02-01 ENCOUNTER — Ambulatory Visit (INDEPENDENT_AMBULATORY_CARE_PROVIDER_SITE_OTHER): Payer: 59 | Admitting: *Deleted

## 2024-02-01 DIAGNOSIS — J309 Allergic rhinitis, unspecified: Secondary | ICD-10-CM | POA: Diagnosis not present

## 2024-02-28 ENCOUNTER — Ambulatory Visit (INDEPENDENT_AMBULATORY_CARE_PROVIDER_SITE_OTHER)

## 2024-02-28 DIAGNOSIS — J309 Allergic rhinitis, unspecified: Secondary | ICD-10-CM | POA: Diagnosis not present

## 2024-03-27 ENCOUNTER — Ambulatory Visit (INDEPENDENT_AMBULATORY_CARE_PROVIDER_SITE_OTHER)

## 2024-03-27 DIAGNOSIS — J309 Allergic rhinitis, unspecified: Secondary | ICD-10-CM

## 2024-04-28 ENCOUNTER — Ambulatory Visit (INDEPENDENT_AMBULATORY_CARE_PROVIDER_SITE_OTHER)

## 2024-04-28 DIAGNOSIS — J309 Allergic rhinitis, unspecified: Secondary | ICD-10-CM

## 2024-05-15 ENCOUNTER — Other Ambulatory Visit: Payer: Self-pay | Admitting: Obstetrics and Gynecology

## 2024-05-15 DIAGNOSIS — Z1231 Encounter for screening mammogram for malignant neoplasm of breast: Secondary | ICD-10-CM

## 2024-05-27 ENCOUNTER — Ambulatory Visit (INDEPENDENT_AMBULATORY_CARE_PROVIDER_SITE_OTHER)

## 2024-05-27 DIAGNOSIS — J309 Allergic rhinitis, unspecified: Secondary | ICD-10-CM | POA: Diagnosis not present

## 2024-06-04 ENCOUNTER — Other Ambulatory Visit: Payer: Self-pay | Admitting: Internal Medicine

## 2024-06-04 ENCOUNTER — Ambulatory Visit

## 2024-06-04 NOTE — Telephone Encounter (Signed)
 Courtesy refill for EpiPen0.3 mg x 1 with no refills sent to [facility]. Patient informed that she is due for an OV and appt was made.

## 2024-06-16 ENCOUNTER — Ambulatory Visit
Admission: RE | Admit: 2024-06-16 | Discharge: 2024-06-16 | Disposition: A | Discharge status: Home / Self Care | Source: Ambulatory Visit | Attending: Obstetrics and Gynecology | Admitting: Obstetrics and Gynecology

## 2024-06-16 DIAGNOSIS — Z1231 Encounter for screening mammogram for malignant neoplasm of breast: Secondary | ICD-10-CM

## 2024-06-19 DIAGNOSIS — J302 Other seasonal allergic rhinitis: Secondary | ICD-10-CM | POA: Diagnosis not present

## 2024-06-19 NOTE — Progress Notes (Signed)
 VIAL MADE ON 06/19/24

## 2024-06-24 ENCOUNTER — Ambulatory Visit (INDEPENDENT_AMBULATORY_CARE_PROVIDER_SITE_OTHER): Admitting: Internal Medicine

## 2024-06-24 ENCOUNTER — Ambulatory Visit: Payer: Self-pay

## 2024-06-24 ENCOUNTER — Encounter: Payer: Self-pay | Admitting: Internal Medicine

## 2024-06-24 VITALS — BP 110/78 | HR 85 | Temp 97.7°F | Resp 18 | Ht 63.0 in | Wt 241.0 lb

## 2024-06-24 DIAGNOSIS — J4530 Mild persistent asthma, uncomplicated: Secondary | ICD-10-CM | POA: Diagnosis not present

## 2024-06-24 DIAGNOSIS — H1013 Acute atopic conjunctivitis, bilateral: Secondary | ICD-10-CM | POA: Diagnosis not present

## 2024-06-24 DIAGNOSIS — J302 Other seasonal allergic rhinitis: Secondary | ICD-10-CM | POA: Diagnosis not present

## 2024-06-24 DIAGNOSIS — K219 Gastro-esophageal reflux disease without esophagitis: Secondary | ICD-10-CM

## 2024-06-24 DIAGNOSIS — J3089 Other allergic rhinitis: Secondary | ICD-10-CM | POA: Diagnosis not present

## 2024-06-24 DIAGNOSIS — D72110 Idiopathic hypereosinophilic syndrome (ihes): Secondary | ICD-10-CM

## 2024-06-24 DIAGNOSIS — H1010 Acute atopic conjunctivitis, unspecified eye: Secondary | ICD-10-CM

## 2024-06-24 MED ORDER — AIRSUPRA 90-80 MCG/ACT IN AERO: 2.0000 | INHALATION_SPRAY | RESPIRATORY_TRACT | 5 refills | Status: DC | PRN

## 2024-06-24 NOTE — Patient Instructions (Addendum)
 Recurrent Bronchitis: well controlled  Breathing test: looked great   PLAN:  - Spacer use reviewed. - Daily controller medication(s): Singulair 10mg  daily - Prior to physical activity: Air supra 2 puffs 10-15 minutes before physical activity. - Rescue medications: air supra 2 puffs every 4 hours   - Asthma control goals:  * Full participation in all desired activities (may need albuterol  before activity) * Albuterol  use two time or less a week on average (not counting use with activity) * Cough interfering with sleep two time or less a month * Oral steroids no more than once a year * No hospitalizations  Chronic  Rhinitis: improved  - Continue avoidance measures to tree pollen and mold  - Continue allergy  injections per protocol and carry epipen .  - Continue  dymista  1 spray per nostril twice daily   - Continue  atrovent  1 spray per nostril up to 4 times a day for drainage  - Continue nasal sinus rinses 10-15 minutes prior to nose sprays  - Continue Singulair (Montelukast) 10mg  nightly. - Continue over the counter antihistamine daily or daily as needed.   -Your options include Zyrtec (Cetirizine) 10mg , Claritin (Loratadine) 10mg , Allegra (Fexofenadine) 180mg , or Xyzal (Levocetirinze) 5mg   Allergic Conjunctivitis:  - Consider Allergy  Eye drops: great options include Pataday (Olopatadine) or Zaditor (ketotifen) for eye symptoms daily as needed-both sold over the counter if not covered by insurance.   -Avoid eye drops that say red eye relief as they may contain medications that dry out your eyes.   HES: currently asymptomatic/resolved  - Labs are stable, we will continue to monitor   Follow up: 12 months   Thank you so much for letting me partake in your care today.  Don't hesitate to reach out if you have any additional concerns!  Hargis Springer, MD  [facility] [place], [place]

## 2024-06-24 NOTE — Progress Notes (Addendum)
 FOLLOW UP Date of Service/Encounter:  06/24/24  Subjective:  Margaret Gross (DOB: [birth year]) is a 58 y.o. female who [facility] in re-evaluation of the following: rhinitis on AIT, recurrent bronchitis  History obtained from: chart review and patient.  For Review, LV was on 04/03/23  with Dr. Lorin seen for [facility] appointment . See below for summary of history and diagnostics.    Today presents for follow-up. Discussed the use of AI scribe software for clinical note transcription with the patient, who gave verbal consent to proceed.  History of Present Illness Margaret Gross is a 58 year old female who presents for follow-up on allergy  treatment.  Allergic rhinitis and seasonal allergy  symptoms - Allergy  symptoms improved this spring compared to previous years, despite a severe allergy  season - Continues with allergy  immunotherapy injections with partial symptom control, no systemic or local reactions  - No wheezing, swollen eyes, or need for prednisone  this spring - Current medications: Xyzal in the morning, Singulair and Claritin in the evening - Uses atrovent  and dymista  as needed  - Adjusts medication regimen based on symptom severity - Persistent mucus accumulation, described as a long-standing issue  Asthma and airway reactivity - Uses Arnuity inhaler as needed, approximately two to three times per week, not on a daily basis - No recent need for rescue inhaler - No wheezing reported this spring  Hypereosinophilic syndrome (hes) and dermatologic symptoms - No current issues with HES - No severe itching or rashes as previously experienced - No recent skin symptoms related to HES  Gastroesophageal reflux symptoms - Occasional reflux symptoms managed with as-needed Prilosec - No significant heartburn     All medications reviewed by clinical staff and updated in chart. No new pertinent medical or surgical history except as  noted in HPI.  ROS: All others negative except as noted per HPI.   Objective:  BP 110/78 (BP Location: Left Arm, Patient Position: Sitting, Cuff Size: Large)   Pulse 85   Temp 97.7 F (36.5 C) (Temporal)   Resp 18   Ht 5' 3 (1.6 m)   Wt 241 lb (109.3 kg)   SpO2 100%   BMI 42.69 kg/m  Body mass index is 42.69 kg/m. Physical Exam: General Appearance:  Alert, cooperative, no distress, appears stated age  Head:  Normocephalic, without obvious abnormality, atraumatic  Eyes:  Conjunctiva clear, EOM's intact  Ears EACs normal bilaterally  Nose: Nares normal, erythematous nasal mucosa  , no visible anterior polyps, and septum midline  Throat: Lips, tongue normal; teeth and gums normal, + cobblestoning  Neck: Supple, symmetrical  Lungs:   clear to auscultation bilaterally, Respirations unlabored, no coughing  Heart:  regular rate and rhythm and no murmur, Appears well perfused  Extremities: No edema  Skin: Skin color, texture, turgor normal and no rashes or lesions on visualized portions of skin  Neurologic: No gross deficits   Labs:  Lab Orders  No laboratory test(s) ordered today   Spirometry:  Tracings reviewed. Her effort: Good reproducible efforts. FVC: 2.60L FEV1: 2.19L, 83% predicted FEV1/FVC ratio: 84% Interpretation: Spirometry consistent with normal pattern.  Please see scanned spirometry results for details.    Assessment/Plan   Patient Instructions  Recurrent Bronchitis: well controlled  Breathing test: looked great   PLAN:  - Spacer use reviewed. - Daily controller medication(s): Singulair 10mg  daily - Prior to physical activity: Air supra 2 puffs 10-15 minutes before physical activity. - Rescue medications: air  supra 2 puffs every 4 hours   - Asthma control goals:  * Full participation in all desired activities (may need albuterol  before activity) * Albuterol  use two time or less a week on average (not counting use with activity) * Cough interfering  with sleep two time or less a month * Oral steroids no more than once a year * No hospitalizations  Chronic  Rhinitis: improved  - Continue avoidance measures to tree pollen and mold  - Continue allergy  injections per protocol and carry epipen .  - Continue  dymista  1 spray per nostril twice daily   - Continue  atrovent  1 spray per nostril up to 4 times a day for drainage  - Continue nasal sinus rinses 10-15 minutes prior to nose sprays  - Continue Singulair (Montelukast) 10mg  nightly. - Continue over the counter antihistamine daily or daily as needed.   -Your options include Zyrtec (Cetirizine) 10mg , Claritin (Loratadine) 10mg , Allegra (Fexofenadine) 180mg , or Xyzal (Levocetirinze) 5mg   Allergic Conjunctivitis:  - Consider Allergy  Eye drops: great options include Pataday (Olopatadine) or Zaditor (ketotifen) for eye symptoms daily as needed-both sold over the counter if not covered by insurance.   -Avoid eye drops that say red eye relief as they may contain medications that dry out your eyes.   HES: currently asymptomatic/resolved  - Labs are stable, we will continue to monitor   Follow up: 12 months   Thank you so much for letting me partake in your care today.  Don't hesitate to reach out if you have any additional concerns!  Hargis Springer, MD  [facility] [place] [place]    Other: reviewed spirometry technique and reviewed inhaler technique  Thank you so much for letting me partake in your care today.  Don't hesitate to reach out if you have any additional concerns!  Hargis Springer, MD  [facility] [place], [place]

## 2024-07-15 ENCOUNTER — Other Ambulatory Visit: Payer: Self-pay | Admitting: *Deleted

## 2024-07-15 MED ORDER — AIRSUPRA 90-80 MCG/ACT IN AERO: 2.0000 | INHALATION_SPRAY | RESPIRATORY_TRACT | 0 refills | Status: DC | PRN

## 2024-08-01 ENCOUNTER — Ambulatory Visit (INDEPENDENT_AMBULATORY_CARE_PROVIDER_SITE_OTHER)

## 2024-08-01 DIAGNOSIS — J309 Allergic rhinitis, unspecified: Secondary | ICD-10-CM | POA: Diagnosis not present

## 2024-08-01 DIAGNOSIS — H1010 Acute atopic conjunctivitis, unspecified eye: Secondary | ICD-10-CM

## 2024-08-08 ENCOUNTER — Ambulatory Visit (INDEPENDENT_AMBULATORY_CARE_PROVIDER_SITE_OTHER)

## 2024-08-08 DIAGNOSIS — J302 Other seasonal allergic rhinitis: Secondary | ICD-10-CM | POA: Diagnosis not present

## 2024-08-08 DIAGNOSIS — H1010 Acute atopic conjunctivitis, unspecified eye: Secondary | ICD-10-CM

## 2024-08-15 ENCOUNTER — Ambulatory Visit (INDEPENDENT_AMBULATORY_CARE_PROVIDER_SITE_OTHER): Payer: Self-pay

## 2024-08-15 DIAGNOSIS — J302 Other seasonal allergic rhinitis: Secondary | ICD-10-CM | POA: Diagnosis not present

## 2024-08-15 DIAGNOSIS — H1010 Acute atopic conjunctivitis, unspecified eye: Secondary | ICD-10-CM

## 2024-09-16 ENCOUNTER — Ambulatory Visit (INDEPENDENT_AMBULATORY_CARE_PROVIDER_SITE_OTHER)

## 2024-09-16 DIAGNOSIS — J3089 Other allergic rhinitis: Secondary | ICD-10-CM | POA: Diagnosis not present

## 2024-09-16 DIAGNOSIS — J302 Other seasonal allergic rhinitis: Secondary | ICD-10-CM

## 2024-10-07 ENCOUNTER — Other Ambulatory Visit: Payer: Self-pay | Admitting: Family Medicine

## 2024-10-07 ENCOUNTER — Ambulatory Visit
Admission: RE | Admit: 2024-10-07 | Discharge: 2024-10-07 | Disposition: A | Discharge status: Home / Self Care | Source: Ambulatory Visit | Attending: Family Medicine | Admitting: Family Medicine

## 2024-10-07 DIAGNOSIS — R1032 Left lower quadrant pain: Secondary | ICD-10-CM

## 2024-10-07 MED ORDER — IOPAMIDOL (ISOVUE-300) INJECTION 61%
60.0000 mL | Freq: Once | INTRAVENOUS | Status: AC | PRN
  Administered 2024-10-07: 60 mL via INTRAVENOUS

## 2024-10-17 ENCOUNTER — Ambulatory Visit (INDEPENDENT_AMBULATORY_CARE_PROVIDER_SITE_OTHER): Admitting: *Deleted

## 2024-10-17 DIAGNOSIS — J309 Allergic rhinitis, unspecified: Secondary | ICD-10-CM | POA: Diagnosis not present

## 2024-11-10 ENCOUNTER — Ambulatory Visit (INDEPENDENT_AMBULATORY_CARE_PROVIDER_SITE_OTHER)

## 2024-11-10 DIAGNOSIS — J309 Allergic rhinitis, unspecified: Secondary | ICD-10-CM

## 2024-12-08 ENCOUNTER — Other Ambulatory Visit: Payer: Self-pay | Admitting: Internal Medicine

## 2024-12-16 ENCOUNTER — Ambulatory Visit (INDEPENDENT_AMBULATORY_CARE_PROVIDER_SITE_OTHER)

## 2024-12-16 DIAGNOSIS — J302 Other seasonal allergic rhinitis: Secondary | ICD-10-CM

## 2024-12-16 DIAGNOSIS — H1010 Acute atopic conjunctivitis, unspecified eye: Secondary | ICD-10-CM

## 2024-12-16 DIAGNOSIS — J3089 Other allergic rhinitis: Secondary | ICD-10-CM | POA: Diagnosis not present

## 2025-01-09 ENCOUNTER — Ambulatory Visit (INDEPENDENT_AMBULATORY_CARE_PROVIDER_SITE_OTHER)

## 2025-01-09 DIAGNOSIS — J302 Other seasonal allergic rhinitis: Secondary | ICD-10-CM | POA: Diagnosis not present
